# Patient Record
Sex: Female | Born: 1940 | Race: White | Hispanic: No | Smoking: Never smoker
Health system: Southern US, Community
[De-identification: ages and names within clinical notes are randomized; demographics above are authoritative.]

## PROBLEM LIST (undated history)

## (undated) DIAGNOSIS — F0390 Unspecified dementia without behavioral disturbance: Secondary | ICD-10-CM

## (undated) DIAGNOSIS — F419 Anxiety disorder, unspecified: Secondary | ICD-10-CM

## (undated) DIAGNOSIS — I10 Essential (primary) hypertension: Secondary | ICD-10-CM

## (undated) DIAGNOSIS — E119 Type 2 diabetes mellitus without complications: Secondary | ICD-10-CM

## (undated) DIAGNOSIS — I4891 Unspecified atrial fibrillation: Secondary | ICD-10-CM

## (undated) HISTORY — PX: ABDOMINAL HYSTERECTOMY: SHX81

---

## 2016-09-28 DIAGNOSIS — E119 Type 2 diabetes mellitus without complications: Secondary | ICD-10-CM | POA: Diagnosis not present

## 2016-09-28 DIAGNOSIS — E782 Mixed hyperlipidemia: Secondary | ICD-10-CM | POA: Diagnosis not present

## 2016-09-28 DIAGNOSIS — K219 Gastro-esophageal reflux disease without esophagitis: Secondary | ICD-10-CM | POA: Diagnosis not present

## 2016-09-28 DIAGNOSIS — I10 Essential (primary) hypertension: Secondary | ICD-10-CM | POA: Diagnosis not present

## 2016-10-12 DIAGNOSIS — H35373 Puckering of macula, bilateral: Secondary | ICD-10-CM | POA: Diagnosis not present

## 2016-10-12 DIAGNOSIS — H401131 Primary open-angle glaucoma, bilateral, mild stage: Secondary | ICD-10-CM | POA: Diagnosis not present

## 2016-10-12 DIAGNOSIS — E113592 Type 2 diabetes mellitus with proliferative diabetic retinopathy without macular edema, left eye: Secondary | ICD-10-CM | POA: Diagnosis not present

## 2016-10-12 DIAGNOSIS — Z961 Presence of intraocular lens: Secondary | ICD-10-CM | POA: Diagnosis not present

## 2017-01-26 DIAGNOSIS — K219 Gastro-esophageal reflux disease without esophagitis: Secondary | ICD-10-CM | POA: Diagnosis not present

## 2017-01-26 DIAGNOSIS — E782 Mixed hyperlipidemia: Secondary | ICD-10-CM | POA: Diagnosis not present

## 2017-01-26 DIAGNOSIS — I10 Essential (primary) hypertension: Secondary | ICD-10-CM | POA: Diagnosis not present

## 2017-01-26 DIAGNOSIS — E119 Type 2 diabetes mellitus without complications: Secondary | ICD-10-CM | POA: Diagnosis not present

## 2017-02-10 DIAGNOSIS — N390 Urinary tract infection, site not specified: Secondary | ICD-10-CM | POA: Diagnosis not present

## 2017-02-10 DIAGNOSIS — R9431 Abnormal electrocardiogram [ECG] [EKG]: Secondary | ICD-10-CM | POA: Diagnosis not present

## 2017-02-10 DIAGNOSIS — R000 Tachycardia, unspecified: Secondary | ICD-10-CM | POA: Diagnosis not present

## 2017-02-16 DIAGNOSIS — I358 Other nonrheumatic aortic valve disorders: Secondary | ICD-10-CM | POA: Diagnosis not present

## 2017-02-16 DIAGNOSIS — R9431 Abnormal electrocardiogram [ECG] [EKG]: Secondary | ICD-10-CM | POA: Diagnosis not present

## 2017-02-16 DIAGNOSIS — R000 Tachycardia, unspecified: Secondary | ICD-10-CM | POA: Diagnosis not present

## 2017-02-23 DIAGNOSIS — I340 Nonrheumatic mitral (valve) insufficiency: Secondary | ICD-10-CM | POA: Diagnosis not present

## 2017-02-23 DIAGNOSIS — I358 Other nonrheumatic aortic valve disorders: Secondary | ICD-10-CM | POA: Diagnosis not present

## 2017-02-23 DIAGNOSIS — I350 Nonrheumatic aortic (valve) stenosis: Secondary | ICD-10-CM | POA: Diagnosis not present

## 2017-02-23 DIAGNOSIS — I071 Rheumatic tricuspid insufficiency: Secondary | ICD-10-CM | POA: Diagnosis not present

## 2017-03-18 DIAGNOSIS — I10 Essential (primary) hypertension: Secondary | ICD-10-CM | POA: Diagnosis not present

## 2017-03-18 DIAGNOSIS — I471 Supraventricular tachycardia: Secondary | ICD-10-CM | POA: Diagnosis not present

## 2017-03-31 DIAGNOSIS — H35373 Puckering of macula, bilateral: Secondary | ICD-10-CM | POA: Diagnosis not present

## 2017-03-31 DIAGNOSIS — H401131 Primary open-angle glaucoma, bilateral, mild stage: Secondary | ICD-10-CM | POA: Diagnosis not present

## 2017-03-31 DIAGNOSIS — E113592 Type 2 diabetes mellitus with proliferative diabetic retinopathy without macular edema, left eye: Secondary | ICD-10-CM | POA: Diagnosis not present

## 2017-03-31 DIAGNOSIS — Z961 Presence of intraocular lens: Secondary | ICD-10-CM | POA: Diagnosis not present

## 2017-05-28 DIAGNOSIS — K219 Gastro-esophageal reflux disease without esophagitis: Secondary | ICD-10-CM | POA: Diagnosis not present

## 2017-05-28 DIAGNOSIS — R011 Cardiac murmur, unspecified: Secondary | ICD-10-CM | POA: Diagnosis not present

## 2017-05-28 DIAGNOSIS — Z283 Underimmunization status: Secondary | ICD-10-CM | POA: Diagnosis not present

## 2017-05-28 DIAGNOSIS — I10 Essential (primary) hypertension: Secondary | ICD-10-CM | POA: Diagnosis not present

## 2017-05-28 DIAGNOSIS — Z23 Encounter for immunization: Secondary | ICD-10-CM | POA: Diagnosis not present

## 2017-05-28 DIAGNOSIS — E782 Mixed hyperlipidemia: Secondary | ICD-10-CM | POA: Diagnosis not present

## 2017-05-28 DIAGNOSIS — M1990 Unspecified osteoarthritis, unspecified site: Secondary | ICD-10-CM | POA: Diagnosis not present

## 2017-05-28 DIAGNOSIS — Z0000 Encounter for general adult medical examination without abnormal findings: Secondary | ICD-10-CM | POA: Diagnosis not present

## 2017-05-28 DIAGNOSIS — E119 Type 2 diabetes mellitus without complications: Secondary | ICD-10-CM | POA: Diagnosis not present

## 2017-06-22 DIAGNOSIS — R300 Dysuria: Secondary | ICD-10-CM | POA: Diagnosis not present

## 2017-06-22 DIAGNOSIS — E119 Type 2 diabetes mellitus without complications: Secondary | ICD-10-CM | POA: Diagnosis not present

## 2017-06-23 DIAGNOSIS — R300 Dysuria: Secondary | ICD-10-CM | POA: Diagnosis not present

## 2017-07-08 DIAGNOSIS — R300 Dysuria: Secondary | ICD-10-CM | POA: Diagnosis not present

## 2017-07-08 DIAGNOSIS — N390 Urinary tract infection, site not specified: Secondary | ICD-10-CM | POA: Diagnosis not present

## 2017-07-08 DIAGNOSIS — E1165 Type 2 diabetes mellitus with hyperglycemia: Secondary | ICD-10-CM | POA: Diagnosis not present

## 2017-07-22 DIAGNOSIS — N390 Urinary tract infection, site not specified: Secondary | ICD-10-CM | POA: Diagnosis not present

## 2017-07-22 DIAGNOSIS — E1165 Type 2 diabetes mellitus with hyperglycemia: Secondary | ICD-10-CM | POA: Diagnosis not present

## 2017-08-02 DIAGNOSIS — Z961 Presence of intraocular lens: Secondary | ICD-10-CM | POA: Diagnosis not present

## 2017-08-02 DIAGNOSIS — E113592 Type 2 diabetes mellitus with proliferative diabetic retinopathy without macular edema, left eye: Secondary | ICD-10-CM | POA: Diagnosis not present

## 2017-08-02 DIAGNOSIS — H401131 Primary open-angle glaucoma, bilateral, mild stage: Secondary | ICD-10-CM | POA: Diagnosis not present

## 2017-08-02 DIAGNOSIS — H35373 Puckering of macula, bilateral: Secondary | ICD-10-CM | POA: Diagnosis not present

## 2017-09-29 DIAGNOSIS — E119 Type 2 diabetes mellitus without complications: Secondary | ICD-10-CM | POA: Diagnosis not present

## 2017-09-29 DIAGNOSIS — N390 Urinary tract infection, site not specified: Secondary | ICD-10-CM | POA: Diagnosis not present

## 2017-09-29 DIAGNOSIS — E782 Mixed hyperlipidemia: Secondary | ICD-10-CM | POA: Diagnosis not present

## 2017-09-29 DIAGNOSIS — K219 Gastro-esophageal reflux disease without esophagitis: Secondary | ICD-10-CM | POA: Diagnosis not present

## 2017-10-14 DIAGNOSIS — R319 Hematuria, unspecified: Secondary | ICD-10-CM | POA: Diagnosis not present

## 2017-10-14 DIAGNOSIS — I10 Essential (primary) hypertension: Secondary | ICD-10-CM | POA: Diagnosis not present

## 2017-10-14 DIAGNOSIS — I5022 Chronic systolic (congestive) heart failure: Secondary | ICD-10-CM | POA: Diagnosis present

## 2017-10-14 DIAGNOSIS — N12 Tubulo-interstitial nephritis, not specified as acute or chronic: Secondary | ICD-10-CM | POA: Diagnosis not present

## 2017-10-14 DIAGNOSIS — K820 Obstruction of gallbladder: Secondary | ICD-10-CM | POA: Diagnosis not present

## 2017-10-14 DIAGNOSIS — E119 Type 2 diabetes mellitus without complications: Secondary | ICD-10-CM | POA: Diagnosis not present

## 2017-10-14 DIAGNOSIS — N390 Urinary tract infection, site not specified: Secondary | ICD-10-CM | POA: Diagnosis present

## 2017-10-14 DIAGNOSIS — I4891 Unspecified atrial fibrillation: Secondary | ICD-10-CM | POA: Diagnosis not present

## 2017-10-14 DIAGNOSIS — R009 Unspecified abnormalities of heart beat: Secondary | ICD-10-CM | POA: Diagnosis not present

## 2017-10-14 DIAGNOSIS — E871 Hypo-osmolality and hyponatremia: Secondary | ICD-10-CM | POA: Diagnosis not present

## 2017-10-14 DIAGNOSIS — E1165 Type 2 diabetes mellitus with hyperglycemia: Secondary | ICD-10-CM | POA: Diagnosis present

## 2017-10-14 DIAGNOSIS — B9620 Unspecified Escherichia coli [E. coli] as the cause of diseases classified elsewhere: Secondary | ICD-10-CM | POA: Diagnosis present

## 2017-10-14 DIAGNOSIS — R339 Retention of urine, unspecified: Secondary | ICD-10-CM | POA: Diagnosis present

## 2017-10-14 DIAGNOSIS — D649 Anemia, unspecified: Secondary | ICD-10-CM | POA: Diagnosis present

## 2017-10-14 DIAGNOSIS — K5730 Diverticulosis of large intestine without perforation or abscess without bleeding: Secondary | ICD-10-CM | POA: Diagnosis not present

## 2017-10-14 DIAGNOSIS — Z90710 Acquired absence of both cervix and uterus: Secondary | ICD-10-CM | POA: Diagnosis not present

## 2017-10-20 DIAGNOSIS — E119 Type 2 diabetes mellitus without complications: Secondary | ICD-10-CM | POA: Diagnosis not present

## 2017-10-20 DIAGNOSIS — N390 Urinary tract infection, site not specified: Secondary | ICD-10-CM | POA: Diagnosis not present

## 2017-10-20 DIAGNOSIS — M6281 Muscle weakness (generalized): Secondary | ICD-10-CM | POA: Diagnosis not present

## 2017-10-20 DIAGNOSIS — I4891 Unspecified atrial fibrillation: Secondary | ICD-10-CM | POA: Diagnosis not present

## 2017-10-25 DIAGNOSIS — E119 Type 2 diabetes mellitus without complications: Secondary | ICD-10-CM | POA: Diagnosis not present

## 2017-10-25 DIAGNOSIS — I429 Cardiomyopathy, unspecified: Secondary | ICD-10-CM | POA: Diagnosis not present

## 2017-10-25 DIAGNOSIS — I517 Cardiomegaly: Secondary | ICD-10-CM | POA: Diagnosis not present

## 2017-10-25 DIAGNOSIS — I4891 Unspecified atrial fibrillation: Secondary | ICD-10-CM | POA: Diagnosis not present

## 2017-10-25 DIAGNOSIS — I10 Essential (primary) hypertension: Secondary | ICD-10-CM | POA: Diagnosis not present

## 2017-10-25 DIAGNOSIS — Z6824 Body mass index (BMI) 24.0-24.9, adult: Secondary | ICD-10-CM | POA: Diagnosis not present

## 2017-11-09 DIAGNOSIS — E1165 Type 2 diabetes mellitus with hyperglycemia: Secondary | ICD-10-CM | POA: Diagnosis not present

## 2017-11-09 DIAGNOSIS — N390 Urinary tract infection, site not specified: Secondary | ICD-10-CM | POA: Diagnosis not present

## 2017-11-09 DIAGNOSIS — I4891 Unspecified atrial fibrillation: Secondary | ICD-10-CM | POA: Diagnosis not present

## 2017-11-09 DIAGNOSIS — I10 Essential (primary) hypertension: Secondary | ICD-10-CM | POA: Diagnosis not present

## 2017-12-13 DIAGNOSIS — E113592 Type 2 diabetes mellitus with proliferative diabetic retinopathy without macular edema, left eye: Secondary | ICD-10-CM | POA: Diagnosis not present

## 2017-12-13 DIAGNOSIS — H35373 Puckering of macula, bilateral: Secondary | ICD-10-CM | POA: Diagnosis not present

## 2017-12-13 DIAGNOSIS — H401131 Primary open-angle glaucoma, bilateral, mild stage: Secondary | ICD-10-CM | POA: Diagnosis not present

## 2017-12-20 DIAGNOSIS — E1165 Type 2 diabetes mellitus with hyperglycemia: Secondary | ICD-10-CM | POA: Diagnosis not present

## 2017-12-20 DIAGNOSIS — K219 Gastro-esophageal reflux disease without esophagitis: Secondary | ICD-10-CM | POA: Diagnosis not present

## 2017-12-20 DIAGNOSIS — E782 Mixed hyperlipidemia: Secondary | ICD-10-CM | POA: Diagnosis not present

## 2017-12-20 DIAGNOSIS — E119 Type 2 diabetes mellitus without complications: Secondary | ICD-10-CM | POA: Diagnosis not present

## 2017-12-20 DIAGNOSIS — N390 Urinary tract infection, site not specified: Secondary | ICD-10-CM | POA: Diagnosis not present

## 2018-02-28 ENCOUNTER — Ambulatory Visit
Admission: EM | Admit: 2018-02-28 | Discharge: 2018-02-28 | Disposition: A | Discharge status: Home / Self Care | Payer: Medicare Other | Attending: Emergency Medicine | Admitting: Emergency Medicine

## 2018-02-28 ENCOUNTER — Other Ambulatory Visit: Payer: Self-pay

## 2018-02-28 DIAGNOSIS — N3001 Acute cystitis with hematuria: Secondary | ICD-10-CM

## 2018-02-28 DIAGNOSIS — R350 Frequency of micturition: Secondary | ICD-10-CM | POA: Diagnosis not present

## 2018-02-28 LAB — URINALYSIS, COMPLETE (UACMP) WITH MICROSCOPIC
Bilirubin Urine: NEGATIVE
GLUCOSE, UA: NEGATIVE mg/dL
Ketones, ur: NEGATIVE mg/dL
Nitrite: POSITIVE — AB
PH: 6 (ref 5.0–8.0)
Protein, ur: NEGATIVE mg/dL
SPECIFIC GRAVITY, URINE: 1.01 (ref 1.005–1.030)
WBC, UA: >50 WBC/hpf (ref 0–5)

## 2018-02-28 MED ORDER — CEPHALEXIN 500 MG PO CAPS: 500.0000 mg | ORAL_CAPSULE | Freq: Two times a day (BID) | ORAL | 0 refills | Status: AC

## 2018-02-28 NOTE — ED Triage Notes (Signed)
Patient presents to [facility] with daughter. Patient has been noticing urinary frequency and noticed some "memory lapses" that aren't normal but has been seen before with her previous UTIs.

## 2018-02-28 NOTE — ED Provider Notes (Signed)
[facility]    CSN: [id number] Arrival date & time: 02/28/18  [id number]     History   Chief Complaint Chief Complaint  Patient presents with  . Urinary Frequency    HPI Jenna Odom is a 77 y.o. female.   77 year old female brought in by her daughter with concern over urinary frequency and urgency for the past few days. Also has notice some changes in memory and cognition. Denies any fever, dysuria, hematuria, back or abdominal pain or unusual vaginal discharge. Last time she had some cognitive changes, she had a UTI so requests urinalysis. History of 3 UTI's in the past year but no prior UTI before 2018. Was first placed on Cipro and had a reaction. Has taken Macrobid and Keflex with good success. Other chronic health issues include type 2 DM, A fib, HTN and anxiety and currently on Insulin, Metformin, Amaryl, Eliquis and Buspar daily. Only takes Digoxin when heart rate is above 60. Staying with daughter here but patient still lives in [place].   The history is provided by the patient and a relative.    History reviewed. No pertinent past medical history.  There are no active problems to display for this patient.   Past Surgical History:  Procedure Laterality Date  . ABDOMINAL HYSTERECTOMY      OB History   None      Home Medications    Prior to Admission medications   Medication Sig Start Date End Date Taking? Authorizing Provider  apixaban (ELIQUIS) 5 MG TABS tablet Take 5 mg by mouth daily.   Yes [provider]  busPIRone (BUSPAR) 15 MG tablet Take 15 mg by mouth 3 (three) times daily.   Yes [provider]  digoxin (LANOXIN) 0.25 MG tablet Take 0.25 mg by mouth daily.   Yes [provider]  glimepiride (AMARYL) 4 MG tablet Take 4 mg by mouth daily with breakfast.   Yes [provider]  insulin aspart protamine- aspart (NOVOLOG MIX 70/30) (70-30) 100 UNIT/ML injection Inject into the skin.   Yes [provider]  metFORMIN (GLUCOPHAGE) 1000 MG tablet Take 1,000 mg by mouth 2 (two) times daily with a meal.   Yes [provider]  cephALEXin (KEFLEX) 500 MG capsule Take 1 capsule (500 mg total) by mouth 2 (two) times daily for 7 days. 02/28/18 03/07/18  Sudie GrumblingAmyot, Ann Berry, NP    Family History Family History  Problem Relation Age of Onset  . Dementia Mother     Social History Social History   Tobacco Use  . Smoking status: Never Smoker  . Smokeless tobacco: Never Used  Substance Use Topics  . Alcohol use: Never    Frequency: Never  . Drug use: Never     Allergies   Ciprofloxacin   Review of Systems Review of Systems  Constitutional: Negative for activity change, appetite change, chills, fatigue and fever.  Respiratory: Negative for cough, chest tightness, shortness of breath and wheezing.   Cardiovascular: Negative for chest pain and palpitations.  Gastrointestinal: Negative for abdominal pain, diarrhea, nausea and vomiting.  Genitourinary: Positive for decreased urine volume, frequency and urgency. Negative for difficulty urinating, dysuria, flank pain, hematuria, pelvic pain and vaginal discharge.  Musculoskeletal: Positive for arthralgias. Negative for back pain.  Skin: Negative for rash and wound.  Neurological: Negative for dizziness, tremors, seizures, syncope, speech difficulty, weakness, light-headedness and headaches.  Hematological: Negative for adenopathy. Bruises/bleeds easily.  Psychiatric/Behavioral: Positive for confusion.     Physical  Exam Triage Vital Signs ED Triage Vitals  Enc Vitals Group     BP 02/28/18 1029 (!) 155/68     Pulse Rate 02/28/18 1029 (!) 49     Resp 02/28/18 1029 18     Temp 02/28/18 1029 97.8 F (36.6 C)     Temp Source 02/28/18 1029 Oral     SpO2 02/28/18 1029 100 %     Weight 02/28/18 1027 135 lb (61.2 kg)     Height 02/28/18 1027 5' 3.5" (1.613 m)     Head Circumference --      Peak Flow --      Pain Score 02/28/18 1026 0      Pain Loc --      Pain Edu? --      Excl. in GC? --    No data found.  Updated Vital Signs BP (!) 155/68 (BP Location: Left Arm)   Pulse (!) 49   Temp 97.8 F (36.6 C) (Oral)   Resp 18   Ht 5' 3.5" (1.613 m)   Wt 135 lb (61.2 kg)   SpO2 100%   BMI 23.54 kg/m   Visual Acuity Right Eye Distance:   Left Eye Distance:   Bilateral Distance:    Right Eye Near:   Left Eye Near:    Bilateral Near:     Physical Exam  Constitutional: She appears well-developed and well-nourished. She is cooperative. She does not appear ill. No distress.  Patient sitting on exam table in no acute distress.Daughter indicates pulse is usually around 50.     HENT:  Head: Normocephalic and atraumatic.  Eyes: Conjunctivae and EOM are normal.  Neck: Normal range of motion.  Cardiovascular: Regular rhythm. Bradycardia present.  Pulmonary/Chest: Effort normal and breath sounds normal. No respiratory distress. She has no decreased breath sounds. She has no wheezes. She has no rhonchi.  Abdominal: Soft. Normal appearance and bowel sounds are normal. She exhibits no shifting dullness, no distension, no pulsatile liver, no fluid wave, no abdominal bruit, no ascites, no pulsatile midline mass and no mass. There is no tenderness. There is no rigidity, no rebound, no guarding and no CVA tenderness.  Neurological: She is alert. She is disoriented.  Skin: Skin is warm and dry. No rash noted.  Psychiatric: She has a normal mood and affect. Her speech is normal and behavior is normal. Thought content normal.  Vitals reviewed.    UC Treatments / Results  Labs (all labs ordered are listed, but only abnormal results are displayed) Labs Reviewed  URINALYSIS, COMPLETE (UACMP) WITH MICROSCOPIC - Abnormal; Notable for the following components:      Result Value   APPearance CLOUDY (*)    Hgb urine dipstick TRACE (*)    Nitrite POSITIVE (*)    Leukocytes, UA LARGE (*)    Bacteria, UA MANY (*)    All other  components within normal limits  URINE CULTURE    EKG None  Radiology No results found.  Procedures Procedures (including critical care time)  Medications Ordered in UC Medications - No data to display  Initial Impression / Assessment and Plan / UC Course  I have reviewed the triage vital signs and the nursing notes.  Pertinent labs & imaging results that were available during my care of the patient were reviewed by me and considered in my medical decision making (see chart for details).    Reviewed urinalysis results with patient and daughter- probable UTI. Will send urine for culture. Recommend start  Keflex 500mg  twice a day as directed. Increase water intake- avoid soda and caffeine. Continue to monitor sugar/glucose levels. Follow-up pending urine culture results.   Final Clinical Impressions(s) / UC Diagnoses   Final diagnoses:  Urinary frequency  Acute cystitis with hematuria     Discharge Instructions     Recommend start Keflex 500mg  twice a day for 7 days. Increase water intake. Follow-up pending urine culture results.     ED Prescriptions    Medication Sig Dispense Auth. Provider   cephALEXin (KEFLEX) 500 MG capsule Take 1 capsule (500 mg total) by mouth 2 (two) times daily for 7 days. 14 capsule Sudie Grumbling, NP     Controlled Substance Prescriptions [place] Controlled Substance Registry consulted? Not Applicable   Sudie Grumbling, NP 02/28/18 1954

## 2018-02-28 NOTE — Discharge Instructions (Addendum)
Recommend start Keflex 500mg  twice a day for 7 days. Increase water intake. Follow-up pending urine culture results.

## 2018-03-07 DIAGNOSIS — N390 Urinary tract infection, site not specified: Secondary | ICD-10-CM | POA: Diagnosis not present

## 2018-03-07 DIAGNOSIS — I4891 Unspecified atrial fibrillation: Secondary | ICD-10-CM | POA: Diagnosis not present

## 2018-03-07 DIAGNOSIS — E119 Type 2 diabetes mellitus without complications: Secondary | ICD-10-CM | POA: Diagnosis not present

## 2018-03-14 DIAGNOSIS — H35373 Puckering of macula, bilateral: Secondary | ICD-10-CM | POA: Diagnosis not present

## 2018-03-14 DIAGNOSIS — H401131 Primary open-angle glaucoma, bilateral, mild stage: Secondary | ICD-10-CM | POA: Diagnosis not present

## 2018-03-14 DIAGNOSIS — E113592 Type 2 diabetes mellitus with proliferative diabetic retinopathy without macular edema, left eye: Secondary | ICD-10-CM | POA: Diagnosis not present

## 2018-03-14 DIAGNOSIS — Z961 Presence of intraocular lens: Secondary | ICD-10-CM | POA: Diagnosis not present

## 2018-03-25 DIAGNOSIS — N390 Urinary tract infection, site not specified: Secondary | ICD-10-CM | POA: Diagnosis not present

## 2018-03-30 DIAGNOSIS — Z6825 Body mass index (BMI) 25.0-25.9, adult: Secondary | ICD-10-CM | POA: Diagnosis not present

## 2018-03-30 DIAGNOSIS — E119 Type 2 diabetes mellitus without complications: Secondary | ICD-10-CM | POA: Diagnosis not present

## 2018-03-30 DIAGNOSIS — I4891 Unspecified atrial fibrillation: Secondary | ICD-10-CM | POA: Diagnosis not present

## 2018-03-30 DIAGNOSIS — I350 Nonrheumatic aortic (valve) stenosis: Secondary | ICD-10-CM | POA: Diagnosis not present

## 2018-03-30 DIAGNOSIS — I10 Essential (primary) hypertension: Secondary | ICD-10-CM | POA: Diagnosis not present

## 2018-03-30 DIAGNOSIS — R410 Disorientation, unspecified: Secondary | ICD-10-CM | POA: Diagnosis not present

## 2018-04-05 DIAGNOSIS — N390 Urinary tract infection, site not specified: Secondary | ICD-10-CM | POA: Diagnosis not present

## 2018-04-06 DIAGNOSIS — R001 Bradycardia, unspecified: Secondary | ICD-10-CM | POA: Diagnosis not present

## 2018-04-06 DIAGNOSIS — Z87440 Personal history of urinary (tract) infections: Secondary | ICD-10-CM | POA: Diagnosis not present

## 2018-04-06 DIAGNOSIS — Z794 Long term (current) use of insulin: Secondary | ICD-10-CM | POA: Diagnosis not present

## 2018-04-06 DIAGNOSIS — G3184 Mild cognitive impairment, so stated: Secondary | ICD-10-CM | POA: Diagnosis not present

## 2018-04-06 DIAGNOSIS — I4891 Unspecified atrial fibrillation: Secondary | ICD-10-CM | POA: Diagnosis not present

## 2018-04-06 DIAGNOSIS — Z818 Family history of other mental and behavioral disorders: Secondary | ICD-10-CM | POA: Diagnosis not present

## 2018-04-06 DIAGNOSIS — Z820 Family history of epilepsy and other diseases of the nervous system: Secondary | ICD-10-CM | POA: Diagnosis not present

## 2018-04-06 DIAGNOSIS — R030 Elevated blood-pressure reading, without diagnosis of hypertension: Secondary | ICD-10-CM | POA: Diagnosis not present

## 2018-04-06 DIAGNOSIS — Z79899 Other long term (current) drug therapy: Secondary | ICD-10-CM | POA: Diagnosis not present

## 2018-04-06 DIAGNOSIS — I10 Essential (primary) hypertension: Secondary | ICD-10-CM | POA: Diagnosis not present

## 2018-04-06 DIAGNOSIS — F419 Anxiety disorder, unspecified: Secondary | ICD-10-CM | POA: Diagnosis not present

## 2018-04-08 DIAGNOSIS — E119 Type 2 diabetes mellitus without complications: Secondary | ICD-10-CM | POA: Diagnosis not present

## 2018-04-08 DIAGNOSIS — K219 Gastro-esophageal reflux disease without esophagitis: Secondary | ICD-10-CM | POA: Diagnosis not present

## 2018-04-08 DIAGNOSIS — N390 Urinary tract infection, site not specified: Secondary | ICD-10-CM | POA: Diagnosis not present

## 2018-04-08 DIAGNOSIS — I4891 Unspecified atrial fibrillation: Secondary | ICD-10-CM | POA: Diagnosis not present

## 2018-04-11 DIAGNOSIS — N390 Urinary tract infection, site not specified: Secondary | ICD-10-CM | POA: Diagnosis not present

## 2018-04-25 DIAGNOSIS — N184 Chronic kidney disease, stage 4 (severe): Secondary | ICD-10-CM | POA: Diagnosis not present

## 2018-04-25 DIAGNOSIS — N390 Urinary tract infection, site not specified: Secondary | ICD-10-CM | POA: Diagnosis not present

## 2018-04-25 DIAGNOSIS — H6693 Otitis media, unspecified, bilateral: Secondary | ICD-10-CM | POA: Diagnosis not present

## 2018-04-25 DIAGNOSIS — R51 Headache: Secondary | ICD-10-CM | POA: Diagnosis not present

## 2018-04-25 DIAGNOSIS — G4452 New daily persistent headache (NDPH): Secondary | ICD-10-CM | POA: Diagnosis not present

## 2018-05-24 ENCOUNTER — Emergency Department: Payer: Medicare Other

## 2018-05-24 ENCOUNTER — Other Ambulatory Visit: Payer: Self-pay

## 2018-05-24 ENCOUNTER — Emergency Department
Admission: EM | Admit: 2018-05-24 | Discharge: 2018-05-24 | Disposition: A | Discharge status: Home / Self Care | Payer: Medicare Other | Attending: Emergency Medicine | Admitting: Emergency Medicine

## 2018-05-24 ENCOUNTER — Encounter: Payer: Self-pay | Admitting: Emergency Medicine

## 2018-05-24 DIAGNOSIS — S0003XA Contusion of scalp, initial encounter: Secondary | ICD-10-CM | POA: Insufficient documentation

## 2018-05-24 DIAGNOSIS — W108XXA Fall (on) (from) other stairs and steps, initial encounter: Secondary | ICD-10-CM | POA: Insufficient documentation

## 2018-05-24 DIAGNOSIS — S8000XA Contusion of unspecified knee, initial encounter: Secondary | ICD-10-CM

## 2018-05-24 DIAGNOSIS — Y999 Unspecified external cause status: Secondary | ICD-10-CM | POA: Diagnosis not present

## 2018-05-24 DIAGNOSIS — S0990XA Unspecified injury of head, initial encounter: Secondary | ICD-10-CM | POA: Diagnosis not present

## 2018-05-24 DIAGNOSIS — Y9301 Activity, walking, marching and hiking: Secondary | ICD-10-CM | POA: Insufficient documentation

## 2018-05-24 DIAGNOSIS — S022XXA Fracture of nasal bones, initial encounter for closed fracture: Secondary | ICD-10-CM | POA: Insufficient documentation

## 2018-05-24 DIAGNOSIS — S8001XA Contusion of right knee, initial encounter: Secondary | ICD-10-CM | POA: Insufficient documentation

## 2018-05-24 DIAGNOSIS — M7989 Other specified soft tissue disorders: Secondary | ICD-10-CM | POA: Diagnosis not present

## 2018-05-24 DIAGNOSIS — I4891 Unspecified atrial fibrillation: Secondary | ICD-10-CM | POA: Diagnosis not present

## 2018-05-24 DIAGNOSIS — Y929 Unspecified place or not applicable: Secondary | ICD-10-CM | POA: Insufficient documentation

## 2018-05-24 DIAGNOSIS — W19XXXA Unspecified fall, initial encounter: Secondary | ICD-10-CM

## 2018-05-24 DIAGNOSIS — S8991XA Unspecified injury of right lower leg, initial encounter: Secondary | ICD-10-CM | POA: Diagnosis not present

## 2018-05-24 LAB — CBC WITH DIFFERENTIAL/PLATELET
BASOS ABS: 0 10*3/uL (ref 0–0.1)
BASOS PCT: 0 %
EOS PCT: 0 %
Eosinophils Absolute: 0 10*3/uL (ref 0–0.7)
HCT: 36.6 % (ref 35.0–47.0)
Hemoglobin: 12.4 g/dL (ref 12.0–16.0)
Lymphocytes Relative: 13 %
Lymphs Abs: 1.7 10*3/uL (ref 1.0–3.6)
MCH: 29.6 pg (ref 26.0–34.0)
MCHC: 33.9 g/dL (ref 32.0–36.0)
MCV: 87.5 fL (ref 80.0–100.0)
MONO ABS: 0.6 10*3/uL (ref 0.2–0.9)
Monocytes Relative: 4 %
NEUTROS ABS: 10.7 10*3/uL — AB (ref 1.4–6.5)
Neutrophils Relative %: 83 %
Platelets: 212 10*3/uL (ref 150–440)
RBC: 4.18 MIL/uL (ref 3.80–5.20)
RDW: 15.4 % — AB (ref 11.5–14.5)
WBC: 13 10*3/uL — AB (ref 3.6–11.0)

## 2018-05-24 LAB — COMPREHENSIVE METABOLIC PANEL
ALBUMIN: 4.1 g/dL (ref 3.5–5.0)
ALT: 11 U/L (ref 0–44)
AST: 22 U/L (ref 15–41)
Alkaline Phosphatase: 55 U/L (ref 38–126)
Anion gap: 13 (ref 5–15)
BUN: 19 mg/dL (ref 8–23)
CO2: 24 mmol/L (ref 22–32)
Calcium: 9 mg/dL (ref 8.9–10.3)
Chloride: 98 mmol/L (ref 98–111)
Creatinine, Ser: 1.04 mg/dL — ABNORMAL HIGH (ref 0.44–1.00)
GFR calc non Af Amer: 50 mL/min — ABNORMAL LOW (ref >60)
GFR, EST AFRICAN AMERICAN: 59 mL/min — AB (ref >60)
GLUCOSE: 205 mg/dL — AB (ref 70–99)
POTASSIUM: 4.8 mmol/L (ref 3.5–5.1)
SODIUM: 135 mmol/L (ref 135–145)
TOTAL PROTEIN: 7 g/dL (ref 6.5–8.1)
Total Bilirubin: 1.2 mg/dL (ref 0.3–1.2)

## 2018-05-24 LAB — DIGOXIN LEVEL

## 2018-05-24 LAB — TROPONIN I: Troponin I: <0.03 ng/mL (ref <0.03)

## 2018-05-24 MED ORDER — OXYMETAZOLINE HCL 0.05 % NA SOLN
1.0000 | Freq: Once | NASAL | Status: AC
  Administered 2018-05-24: 1 via NASAL
  Filled 2018-05-24: qty 15

## 2018-05-24 MED ORDER — METOPROLOL TARTRATE 25 MG PO TABS
25.0000 mg | ORAL_TABLET | Freq: Once | ORAL | Status: AC
  Administered 2018-05-24: 25 mg via ORAL
  Filled 2018-05-24: qty 1

## 2018-05-24 MED ORDER — TRAMADOL HCL 50 MG PO TABS: 50.0000 mg | ORAL_TABLET | Freq: Four times a day (QID) | ORAL | 0 refills | Status: DC | PRN

## 2018-05-24 NOTE — ED Triage Notes (Signed)
Pt here with c/o right knee pain and face pain from fall this am around 0230.  Daughter states her mom got turned around in the dark this am and missed the last few steps, nose bled after fall, pt is on blood thinner. Large area of bruising and swelling noted to face, right knee swollen, denies LOC.

## 2018-05-24 NOTE — ED Provider Notes (Signed)
[facility] Emergency Department Provider Note       Time seen: ----------------------------------------- 9:19 AM on 05/24/2018 -----------------------------------------   I have reviewed the triage vital signs and the nursing notes.  HISTORY   Chief Complaint Fall    HPI Jenna Odom is a 77 y.o. female with a history of atrial fibrillation who presents to the ED for a fall.  Patient states she was walking downstairs and thinks she misstepped.  She has not been weak or dizzy, has not felt ill.  She hit her face and right knee.  She is not complaining of any pain but had obvious swelling to her face and knee.  She did have a nosebleed initially but this has resolved.  Currently she is taking Eliquis for anticoagulation.  History reviewed. No pertinent past medical history.  There are no active problems to display for this patient.   Past Surgical History:  Procedure Laterality Date  . ABDOMINAL HYSTERECTOMY      Allergies Ciprofloxacin  Social History Social History   Tobacco Use  . Smoking status: Never Smoker  . Smokeless tobacco: Never Used  Substance Use Topics  . Alcohol use: Never    Frequency: Never  . Drug use: Never   Review of Systems Constitutional: Negative for fever. Eyes: Negative for vision changes ENT: Positive for nosebleed, nasal swelling, facial swelling Cardiovascular: Negative for chest pain. Respiratory: Negative for shortness of breath. Gastrointestinal: Negative for abdominal pain, vomiting and diarrhea. Genitourinary: Negative for dysuria. Musculoskeletal: Positive for right knee pain Skin: Negative for rash. Neurological: Negative for headaches, focal weakness or numbness.  All systems negative/normal/unremarkable except as stated in the HPI  ____________________________________________   PHYSICAL EXAM:  VITAL SIGNS: ED Triage Vitals  Enc Vitals Group     BP 05/24/18 0914 93/71     Pulse Rate  05/24/18 0914 (!) 136     Resp 05/24/18 0914 16     Temp 05/24/18 0914 97.6 F (36.4 C)     Temp src --      SpO2 05/24/18 0914 98 %     Weight 05/24/18 0915 135 lb (61.2 kg)     Height 05/24/18 0915 5\' 2"  (1.575 m)     Head Circumference --      Peak Flow --      Pain Score 05/24/18 0915 10     Pain Loc --      Pain Edu? --      Excl. in GC? --    Constitutional: Alert and oriented. Well appearing and in no distress. Eyes: Conjunctivae are normal. Normal extraocular movements. ENT   Head: Normocephalic, extensive facial ecchymosis is noted, there is widening of the nasal bridge with surrounding ecchymosis consistent with nasal fracture.  There is bilateral infraorbital ecchymosis as well   Nose: No congestion/rhinnorhea.   Mouth/Throat: Mucous membranes are moist.  Swelling of the upper lip with ecchymosis is noted   Neck: No stridor. Cardiovascular: Normal rate, regular rhythm. No murmurs, rubs, or gallops. Respiratory: Normal respiratory effort without tachypnea nor retractions. Breath sounds are clear and equal bilaterally. No wheezes/rales/rhonchi. Gastrointestinal: Soft and nontender. Normal bowel sounds Musculoskeletal: Mild pain with range of motion of the right knee.  Right knee swelling is noted medially with ecchymosis Neurologic:  Normal speech and language. No gross focal neurologic deficits are appreciated.  Skin:  Skin is warm, dry and intact. No rash noted. Psychiatric: Mood and affect are normal. Speech and behavior are normal.  ____________________________________________  EKG: Interpreted by me.  Atrial fibrillation with a rapid ventricular response, rate is 137 bpm, normal QRS, normal QT.  ____________________________________________  ED COURSE:  As part of my medical decision making, I reviewed the following data within the electronic MEDICAL RECORD NUMBER History obtained from family if available, nursing notes, old chart and ekg, as well as notes  from prior ED visits. Patient presented for a fall, we will assess with labs and imaging as indicated at this time.   Procedures ____________________________________________   LABS (pertinent positives/negatives)  Labs Reviewed  CBC WITH DIFFERENTIAL/PLATELET - Abnormal; Notable for the following components:      Result Value   WBC 13.0 (*)    RDW 15.4 (*)    Neutro Abs 10.7 (*)    All other components within normal limits  COMPREHENSIVE METABOLIC PANEL - Abnormal; Notable for the following components:   Glucose, Bld 205 (*)    Creatinine, Ser 1.04 (*)    GFR calc non Af Amer 50 (*)    GFR calc Af Amer 59 (*)    All other components within normal limits  DIGOXIN LEVEL - Abnormal; Notable for the following components:   Digoxin Level <0.2 (*)    All other components within normal limits  TROPONIN I    RADIOLOGY Images were viewed by me  CT head, maxilla facial, right knee x-rays IMPRESSION: Head CT: Atrophy and mild chronic small-vessel ischemic change. No traumatic intracranial finding. Right parietal scalp hematoma.  Maxillofacial CT: Mildly depressed nasal fractures. Fracture of the nasal septum. No other facial fracture. Swelling of the nose and right cheek.  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 IMPRESSION: Moderate degenerative changes in the medial patellar femoral compartments.  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 ____________________________________________  DIFFERENTIAL DIAGNOSIS   Fall, contusion, nasal fracture, subarachnoid hemorrhage, subdural hematoma, arrhythmia, occult infection  FINAL ASSESSMENT AND PLAN  Fall, nasal fractures, knee contusion   Plan: The patient had presented for a fall. Patient's labs did not reveal any acute abnormality. Patient's imaging did reveal nasal fractures as expected.  X-rays of the knee were normal, no intracranial injury was  noted.  She will be discharged with pain medicine and will be referred to ENT for close outpatient follow-up.   Ulice DashJohnathan E Williams, MD   Note: This note was generated in part or whole with voice recognition software. Voice recognition is usually quite accurate but there are transcription errors that can and very often do occur. I apologize for any typographical errors that were not detected and corrected.     Emily FilbertWilliams, Jonathan E, MD 05/24/18 1224

## 2018-05-24 NOTE — ED Notes (Signed)
Pt placed on bedpan and was not able to urinate.

## 2018-05-24 NOTE — ED Notes (Addendum)
Pt fell this morning on the floor while walking in the dark. Pt on blood thinner. Right knee and nose swollen with bruising present around knee and under eyes. A/Ox4. NAD. Can transfer self from wheelchair to bed.

## 2018-05-24 NOTE — ED Notes (Signed)
Pt returned from scans.  

## 2018-05-24 NOTE — ED Notes (Signed)
Pt taken to scans via stretcher.  

## 2018-05-28 ENCOUNTER — Ambulatory Visit
Admission: EM | Admit: 2018-05-28 | Discharge: 2018-05-28 | Disposition: A | Discharge status: Home / Self Care | Payer: Medicare Other | Attending: Family Medicine | Admitting: Family Medicine

## 2018-05-28 ENCOUNTER — Encounter: Payer: Self-pay | Admitting: Gynecology

## 2018-05-28 ENCOUNTER — Ambulatory Visit: Payer: Medicare Other

## 2018-05-28 DIAGNOSIS — I4891 Unspecified atrial fibrillation: Secondary | ICD-10-CM | POA: Diagnosis not present

## 2018-05-28 DIAGNOSIS — Z794 Long term (current) use of insulin: Secondary | ICD-10-CM | POA: Insufficient documentation

## 2018-05-28 DIAGNOSIS — E119 Type 2 diabetes mellitus without complications: Secondary | ICD-10-CM | POA: Diagnosis not present

## 2018-05-28 DIAGNOSIS — Z881 Allergy status to other antibiotic agents status: Secondary | ICD-10-CM | POA: Diagnosis not present

## 2018-05-28 DIAGNOSIS — F419 Anxiety disorder, unspecified: Secondary | ICD-10-CM | POA: Insufficient documentation

## 2018-05-28 DIAGNOSIS — F05 Delirium due to known physiological condition: Secondary | ICD-10-CM

## 2018-05-28 DIAGNOSIS — Z7901 Long term (current) use of anticoagulants: Secondary | ICD-10-CM | POA: Insufficient documentation

## 2018-05-28 DIAGNOSIS — N390 Urinary tract infection, site not specified: Secondary | ICD-10-CM | POA: Diagnosis not present

## 2018-05-28 DIAGNOSIS — S299XXA Unspecified injury of thorax, initial encounter: Secondary | ICD-10-CM | POA: Diagnosis not present

## 2018-05-28 DIAGNOSIS — I700 Atherosclerosis of aorta: Secondary | ICD-10-CM | POA: Diagnosis not present

## 2018-05-28 DIAGNOSIS — Z79899 Other long term (current) drug therapy: Secondary | ICD-10-CM | POA: Diagnosis not present

## 2018-05-28 DIAGNOSIS — R410 Disorientation, unspecified: Secondary | ICD-10-CM

## 2018-05-28 HISTORY — DX: Type 2 diabetes mellitus without complications: E11.9

## 2018-05-28 HISTORY — DX: Unspecified atrial fibrillation: I48.91

## 2018-05-28 HISTORY — DX: Anxiety disorder, unspecified: F41.9

## 2018-05-28 LAB — COMPREHENSIVE METABOLIC PANEL
ALT: 15 U/L (ref 0–44)
AST: 22 U/L (ref 15–41)
Albumin: 4 g/dL (ref 3.5–5.0)
Alkaline Phosphatase: 58 U/L (ref 38–126)
Anion gap: 12 (ref 5–15)
BILIRUBIN TOTAL: 1.2 mg/dL (ref 0.3–1.2)
BUN: 22 mg/dL (ref 8–23)
CHLORIDE: 97 mmol/L — AB (ref 98–111)
CO2: 26 mmol/L (ref 22–32)
CREATININE: 1.19 mg/dL — AB (ref 0.44–1.00)
Calcium: 9 mg/dL (ref 8.9–10.3)
GFR, EST AFRICAN AMERICAN: 50 mL/min — AB (ref >60)
GFR, EST NON AFRICAN AMERICAN: 43 mL/min — AB (ref >60)
Glucose, Bld: 210 mg/dL — ABNORMAL HIGH (ref 70–99)
POTASSIUM: 4.6 mmol/L (ref 3.5–5.1)
Sodium: 135 mmol/L (ref 135–145)
Total Protein: 7.2 g/dL (ref 6.5–8.1)

## 2018-05-28 LAB — CBC WITH DIFFERENTIAL/PLATELET
Basophils Absolute: 0 10*3/uL (ref 0–0.1)
Basophils Relative: 1 %
EOS ABS: 0.1 10*3/uL (ref 0–0.7)
EOS PCT: 1 %
HCT: 35.1 % (ref 35.0–47.0)
Hemoglobin: 11.8 g/dL — ABNORMAL LOW (ref 12.0–16.0)
LYMPHS PCT: 25 %
Lymphs Abs: 1.7 10*3/uL (ref 1.0–3.6)
MCH: 29.6 pg (ref 26.0–34.0)
MCHC: 33.6 g/dL (ref 32.0–36.0)
MCV: 88 fL (ref 80.0–100.0)
Monocytes Absolute: 0.4 10*3/uL (ref 0.2–0.9)
Monocytes Relative: 6 %
NEUTROS PCT: 67 %
Neutro Abs: 4.7 10*3/uL (ref 1.4–6.5)
Platelets: 251 10*3/uL (ref 150–440)
RBC: 3.98 MIL/uL (ref 3.80–5.20)
RDW: 15.1 % — ABNORMAL HIGH (ref 11.5–14.5)
WBC: 6.9 10*3/uL (ref 3.6–11.0)

## 2018-05-28 LAB — URINALYSIS, COMPLETE (UACMP) WITH MICROSCOPIC
Bilirubin Urine: NEGATIVE
GLUCOSE, UA: NEGATIVE mg/dL
HGB URINE DIPSTICK: NEGATIVE
KETONES UR: NEGATIVE mg/dL
NITRITE: NEGATIVE
PH: 5.5 (ref 5.0–8.0)
Specific Gravity, Urine: 1.01 (ref 1.005–1.030)

## 2018-05-28 MED ORDER — CEPHALEXIN 500 MG PO CAPS: 500.0000 mg | ORAL_CAPSULE | Freq: Two times a day (BID) | ORAL | 0 refills | Status: DC

## 2018-05-28 NOTE — ED Triage Notes (Signed)
Per daughter wanted her mom to be check for urinary tract infection.

## 2018-05-28 NOTE — ED Provider Notes (Signed)
[facility]    CSN: [id number] Arrival date & time: 05/28/18  0807     History   Chief Complaint Chief Complaint  Patient presents with  . Urinary Tract Infection    HPI Jenna Odom is a 77 y.o. female.   77 yo female presents with a daughter with a c/o intermittent confusion per daughter. Patient denies any symptoms. Daughter states that patient had a recent fall hitting her face a few days ago and was evaluated in the ED. Daughter also states that in the past patient has had UTIs when similar symptoms have developed. No fevers, vomiting or abdominal pain.   The history is provided by the patient.  Urinary Tract Infection    Past Medical History:  Diagnosis Date  . A-fib (HCC)   . Anxiety   . Diabetes mellitus without complication (HCC)     There are no active problems to display for this patient.   Past Surgical History:  Procedure Laterality Date  . ABDOMINAL HYSTERECTOMY      OB History   None      Home Medications    Prior to Admission medications   Medication Sig Start Date End Date Taking? Authorizing Provider  apixaban (ELIQUIS) 5 MG TABS tablet Take 5 mg by mouth daily.   Yes [provider]  busPIRone (BUSPAR) 15 MG tablet Take 15 mg by mouth 3 (three) times daily.   Yes [provider]  digoxin (LANOXIN) 0.25 MG tablet Take 0.25 mg by mouth daily.   Yes [provider]  glimepiride (AMARYL) 4 MG tablet Take 4 mg by mouth daily with breakfast.   Yes [provider]  insulin aspart protamine- aspart (NOVOLOG MIX 70/30) (70-30) 100 UNIT/ML injection Inject into the skin.   Yes [provider]  metFORMIN (GLUCOPHAGE) 1000 MG tablet Take 1,000 mg by mouth 2 (two) times daily with a meal.   Yes [provider]  traMADol (ULTRAM) 50 MG tablet Take 1 tablet (50 mg total) by mouth every 6 (six) hours as needed. 05/24/18 05/24/19 Yes Emily Filbert, MD  cephALEXin (KEFLEX) 500 MG  capsule Take 1 capsule (500 mg total) by mouth 2 (two) times daily. 05/28/18   Payton Mccallum, MD    Family History Family History  Problem Relation Age of Onset  . Dementia Mother     Social History Social History   Tobacco Use  . Smoking status: Never Smoker  . Smokeless tobacco: Never Used  Substance Use Topics  . Alcohol use: Never    Frequency: Never  . Drug use: Never     Allergies   Ciprofloxacin   Review of Systems Review of Systems   Physical Exam Triage Vital Signs ED Triage Vitals  Enc Vitals Group     BP 05/28/18 0819 102/62     Pulse Rate 05/28/18 0819 (!) 53     Resp 05/28/18 0819 12     Temp 05/28/18 0819 97.7 F (36.5 C)     Temp Source 05/28/18 0819 Axillary     SpO2 05/28/18 0819 98 %     Weight 05/28/18 0815 135 lb (61.2 kg)     Height --      Head Circumference --      Peak Flow --      Pain Score 05/28/18 0815 0     Pain Loc --      Pain Edu? --      Excl. in GC? --  No data found.  Updated Vital Signs BP 102/62 (BP Location: Left Arm)   Pulse (!) 53   Temp 97.7 F (36.5 C) (Axillary)   Resp 12   Wt 61.2 kg   SpO2 98%   BMI 24.69 kg/m   Visual Acuity Right Eye Distance:   Left Eye Distance:   Bilateral Distance:    Right Eye Near:   Left Eye Near:    Bilateral Near:     Physical Exam  Constitutional: She appears well-developed and well-nourished. No distress.  HENT:  Multiple ecchymosis on face  Eyes: Pupils are equal, round, and reactive to light. EOM are normal.  Cardiovascular: Normal rate, regular rhythm and normal heart sounds.  Pulmonary/Chest: Effort normal. No stridor. No respiratory distress. She has no wheezes. She has no rales.  Few rhonchi  Abdominal: Soft. Bowel sounds are normal. She exhibits no distension and no mass. There is tenderness. There is no rebound and no guarding.  Neurological: She is alert. She displays normal reflexes. No cranial nerve deficit or sensory deficit. She exhibits normal  muscle tone. Coordination normal.  Oriented x 2 (baseline)  Skin: She is not diaphoretic.  Nursing note and vitals reviewed.    UC Treatments / Results  Labs (all labs ordered are listed, but only abnormal results are displayed) Labs Reviewed  URINALYSIS, COMPLETE (UACMP) WITH MICROSCOPIC - Abnormal; Notable for the following components:      Result Value   APPearance HAZY (*)    Protein, ur TRACE (*)    Leukocytes, UA LARGE (*)    Non Squamous Epithelial PRESENT (*)    Bacteria, UA FEW (*)    All other components within normal limits  COMPREHENSIVE METABOLIC PANEL - Abnormal; Notable for the following components:   Chloride 97 (*)    Glucose, Bld 210 (*)    Creatinine, Ser 1.19 (*)    GFR calc non Af Amer 43 (*)    GFR calc Af Amer 50 (*)    All other components within normal limits  CBC WITH DIFFERENTIAL/PLATELET - Abnormal; Notable for the following components:   Hemoglobin 11.8 (*)    RDW 15.1 (*)    All other components within normal limits  URINE CULTURE    EKG None  Radiology Dg Chest 2 View  Result Date: 05/28/2018 CLINICAL DATA:  Confusion.  Recent fall. EXAM: CHEST - 2 VIEW COMPARISON:  None. FINDINGS: Heart size and pulmonary vascularity are normal. Aortic atherosclerosis. Lungs are clear. No bone abnormality. IMPRESSION: No acute abnormality. Aortic Atherosclerosis (ICD10-I70.0). Electronically Signed   By: Francene BoyersJames  Maxwell M.D.   On: 05/28/2018 09:42    Procedures Procedures (including critical care time)  Medications Ordered in UC Medications - No data to display  Initial Impression / Assessment and Plan / UC Course  I have reviewed the triage vital signs and the nursing notes.  Pertinent labs & imaging results that were available during my care of the patient were reviewed by me and considered in my medical decision making (see chart for details).      Final Clinical Impressions(s) / UC Diagnoses   Final diagnoses:  Urinary tract infection  without hematuria, site unspecified  Subacute confusional state     Discharge Instructions     Drink more water    ED Prescriptions    Medication Sig Dispense Auth. Provider   cephALEXin (KEFLEX) 500 MG capsule Take 1 capsule (500 mg total) by mouth 2 (two) times daily. 14 capsule Payton Mccallumonty, Kaliya Shreiner, MD  1. Labs/x-ray results and diagnosis reviewed with patient and daughter 2. rx as per orders above; reviewed possible side effects, interactions, risks and benefits  3. Recommend supportive treatment as above  4. Follow-up prn if symptoms worsen or don't improve   Controlled Substance Prescriptions [place] Controlled Substance Registry consulted? Not Applicable   Payton Mccallum, MD 05/28/18 [id number]

## 2018-05-28 NOTE — Discharge Instructions (Signed)
Drink more water 

## 2018-05-30 LAB — URINE CULTURE
CULTURE: NO GROWTH
SPECIAL REQUESTS: NORMAL

## 2018-06-03 ENCOUNTER — Encounter: Payer: Self-pay | Admitting: Emergency Medicine

## 2018-06-03 ENCOUNTER — Ambulatory Visit
Admission: EM | Admit: 2018-06-03 | Discharge: 2018-06-03 | Disposition: A | Discharge status: Home / Self Care | Payer: Medicare Other | Attending: Emergency Medicine | Admitting: Emergency Medicine

## 2018-06-03 ENCOUNTER — Other Ambulatory Visit: Payer: Self-pay

## 2018-06-03 DIAGNOSIS — R7989 Other specified abnormal findings of blood chemistry: Secondary | ICD-10-CM

## 2018-06-03 DIAGNOSIS — I4891 Unspecified atrial fibrillation: Secondary | ICD-10-CM | POA: Diagnosis not present

## 2018-06-03 DIAGNOSIS — E1165 Type 2 diabetes mellitus with hyperglycemia: Secondary | ICD-10-CM | POA: Diagnosis not present

## 2018-06-03 DIAGNOSIS — Z7901 Long term (current) use of anticoagulants: Secondary | ICD-10-CM | POA: Insufficient documentation

## 2018-06-03 DIAGNOSIS — Z7984 Long term (current) use of oral hypoglycemic drugs: Secondary | ICD-10-CM | POA: Insufficient documentation

## 2018-06-03 DIAGNOSIS — I4892 Unspecified atrial flutter: Secondary | ICD-10-CM | POA: Insufficient documentation

## 2018-06-03 DIAGNOSIS — R410 Disorientation, unspecified: Secondary | ICD-10-CM

## 2018-06-03 DIAGNOSIS — F05 Delirium due to known physiological condition: Secondary | ICD-10-CM

## 2018-06-03 DIAGNOSIS — E119 Type 2 diabetes mellitus without complications: Secondary | ICD-10-CM | POA: Diagnosis not present

## 2018-06-03 DIAGNOSIS — F419 Anxiety disorder, unspecified: Secondary | ICD-10-CM | POA: Insufficient documentation

## 2018-06-03 DIAGNOSIS — Z79899 Other long term (current) drug therapy: Secondary | ICD-10-CM | POA: Insufficient documentation

## 2018-06-03 DIAGNOSIS — R443 Hallucinations, unspecified: Secondary | ICD-10-CM | POA: Diagnosis not present

## 2018-06-03 LAB — CBC WITH DIFFERENTIAL/PLATELET
BASOS PCT: 1 %
Basophils Absolute: 0 10*3/uL (ref 0–0.1)
EOS ABS: 0.2 10*3/uL (ref 0–0.7)
EOS PCT: 2 %
HCT: 34.8 % — ABNORMAL LOW (ref 35.0–47.0)
Hemoglobin: 11.7 g/dL — ABNORMAL LOW (ref 12.0–16.0)
LYMPHS ABS: 2.1 10*3/uL (ref 1.0–3.6)
Lymphocytes Relative: 28 %
MCH: 29.7 pg (ref 26.0–34.0)
MCHC: 33.6 g/dL (ref 32.0–36.0)
MCV: 88.4 fL (ref 80.0–100.0)
MONOS PCT: 5 %
Monocytes Absolute: 0.4 10*3/uL (ref 0.2–0.9)
Neutro Abs: 4.9 10*3/uL (ref 1.4–6.5)
Neutrophils Relative %: 64 %
PLATELETS: 375 10*3/uL (ref 150–440)
RBC: 3.93 MIL/uL (ref 3.80–5.20)
RDW: 15.4 % — ABNORMAL HIGH (ref 11.5–14.5)
WBC: 7.6 10*3/uL (ref 3.6–11.0)

## 2018-06-03 LAB — BASIC METABOLIC PANEL
Anion gap: 10 (ref 5–15)
BUN: 20 mg/dL (ref 8–23)
CHLORIDE: 95 mmol/L — AB (ref 98–111)
CO2: 27 mmol/L (ref 22–32)
CREATININE: 1.53 mg/dL — AB (ref 0.44–1.00)
Calcium: 9.4 mg/dL (ref 8.9–10.3)
GFR calc Af Amer: 37 mL/min — ABNORMAL LOW (ref >60)
GFR calc non Af Amer: 32 mL/min — ABNORMAL LOW (ref >60)
GLUCOSE: 285 mg/dL — AB (ref 70–99)
Potassium: 4.8 mmol/L (ref 3.5–5.1)
Sodium: 132 mmol/L — ABNORMAL LOW (ref 135–145)

## 2018-06-03 LAB — URINALYSIS, COMPLETE (UACMP) WITH MICROSCOPIC
Bilirubin Urine: NEGATIVE
Glucose, UA: NEGATIVE mg/dL
Hgb urine dipstick: NEGATIVE
Ketones, ur: NEGATIVE mg/dL
Nitrite: NEGATIVE
Protein, ur: NEGATIVE mg/dL
RBC / HPF: NONE SEEN RBC/hpf (ref 0–5)
Specific Gravity, Urine: 1.01 (ref 1.005–1.030)
pH: 5.5 (ref 5.0–8.0)

## 2018-06-03 LAB — DIGOXIN LEVEL: Digoxin Level: <0.2 ng/mL — ABNORMAL LOW (ref 0.8–2.0)

## 2018-06-03 NOTE — Discharge Instructions (Addendum)
Her creatinine is a little elevated compared to last time, but I suspect it is because she is not drinking as much fluids.  I would push fluids.  The tramadol may be contributing to the hallucinations but since she was having them prior to starting it, I do not think that this is the root cause.  We will call you if her digoxin level is elevated above normal.  If you do not hear from [place], that her digoxin level is normal.  You will need to go to the ER in that case.  Otherwise her labs are unremarkable.  You need to follow-up with the primary care physician of your choice as soon as you possibly can.  This could be early dementia.  See list below. Go immediately to the ER for any change in mental status, fevers above 100.4, or other concerns.    Here is a list of primary care providers who are taking new patients:  Dr. Elizabeth Sauer, Dr. Schuyler Amor [address] [place] [address] [phone]  [facility] [address] [place] [address]  [phone]  [facility] [address] [place], [place] [address] [phone]  [facility] [address] [place]  [phone] [place], [place] [address]  Here are clinics/ other resources who will see you if you do not have insurance. Some have certain criteria that you must meet. Call them and find out what they are:  [facility]: [address], [place], [place] [address] Phone: [phone] Hours: First and Third Saturdays of each Month, 9 a.m. - 1 p.m.  [facility]: [address], [address] [place], [place] [address] Phone: [phone] Hours: Tuesday, 4 p.m. - 8 p.m. Thursday, 1 p.m. - 8 p.m. Wednesday, 9 a.m. - [facility] [address] [place], [place] [address] Phone: [phone] Pharmacy Phone Number: [phone] Dental Phone Number: [phone] [facility] Insurance Help: [phone]  Dental Hours: Monday - Thursday, 8 a.m. - 6 p.m.  [facility] [address], [place], [place] [address] Phone: [phone] Pharmacy Phone Number: [phone] [facility] Insurance Help: [phone]  [facility] [address] [place] [place], [place] [address] Phone: [phone] Pharmacy Phone Number: [phone] [facility] Insurance Help: [phone]  [facility] [address] [place], [place] [address] Phone: [phone] [facility] Insurance Help: [phone]   [facility] [address], [place], [place] [address] Phone: [phone]  Go to www.goodrx.com to look up your medications. This will give you a list of where you can find your prescriptions at the most affordable prices. Or ask the pharmacist what the cash price is, or if they have any other discount programs available to help make your medication more affordable. This can be less expensive than what you would pay with insurance.

## 2018-06-03 NOTE — ED Triage Notes (Signed)
Patient c/o urinary retension for the past couple of day.  Patient states that her last doses of antibiotic was today for a UTI.  Patient's daughter states that she still seems disorientated.

## 2018-06-03 NOTE — ED Provider Notes (Signed)
HPI  SUBJECTIVE:  Jenna Odom is a 77 y.o. female who presents with 2 weeks of intermittent visual hallucinations.  Denies auditory hallucinations.  Daughter states that the patient is acting normally other than the hallucinations.  States that she gets hallucinations every time patient gets a UTI for the past 2 or 3 years.  No change in medications other than starting tramadol nightly 2 weeks ago status post fall.  She had a mechanical fall 2 weeks ago, was seen in the ED, found to have negative head CT.  Daughter states the patient was having hallucinations prior to the fall.  No change in her digoxin dosing.  No nausea, vomiting, no headache.  No arm or leg weakness, slurred speech, facial droop.  No coughing, wheezing, chest pain, shortness of breath.  Patient states that she feels like she can completely empty her bladder.  No pelvic, abdominal pain.  No urinary incontinence, abdominal swelling, new back pain.  Daughter reports decreased urine output, but states that the patient is not drinking as much water.  There are no aggravating or alleviating factors.  Daughter has not tried anything for this.  Daughter does not bring her in for urinary retention, rather for the hallucinations.  Past medical history of atrial fibrillation which she takes digoxin and Eliquis, diabetes, current UTI.  She is visiting here from [place], staying with daughter. Moved here 6 weeks ago as patient was having difficulty taking care of herself.  Seen here 6 days ago with intermittent confusion, thought to have a UTI with a large leukocytes, few bacteria.  Sent home with Keflex for a week.  Her urine culture was negative for UTI.   Past Medical History:  Diagnosis Date  . A-fib (HCC)   . Anxiety   . Diabetes mellitus without complication [facility])     Past Surgical History:  Procedure Laterality Date  . ABDOMINAL HYSTERECTOMY      Family History  Problem Relation Age of Onset  . Dementia Mother      Social History   Tobacco Use  . Smoking status: Never Smoker  . Smokeless tobacco: Never Used  Substance Use Topics  . Alcohol use: Never    Frequency: Never  . Drug use: Never    No current facility-administered medications for this encounter.   Current Outpatient Medications:  .  apixaban (ELIQUIS) 5 MG TABS tablet, Take 5 mg by mouth daily., Disp: , Rfl:  .  busPIRone (BUSPAR) 15 MG tablet, Take 15 mg by mouth 3 (three) times daily., Disp: , Rfl:  .  cephALEXin (KEFLEX) 500 MG capsule, Take 1 capsule (500 mg total) by mouth 2 (two) times daily., Disp: 14 capsule, Rfl: 0 .  digoxin (LANOXIN) 0.25 MG tablet, Take 0.25 mg by mouth daily., Disp: , Rfl:  .  glimepiride (AMARYL) 4 MG tablet, Take 4 mg by mouth daily with breakfast., Disp: , Rfl:  .  insulin aspart protamine- aspart (NOVOLOG MIX 70/30) (70-30) 100 UNIT/ML injection, Inject into the skin., Disp: , Rfl:  .  metFORMIN (GLUCOPHAGE) 1000 MG tablet, Take 1,000 mg by mouth 2 (two) times daily with a meal., Disp: , Rfl:  .  traMADol (ULTRAM) 50 MG tablet, Take 1 tablet (50 mg total) by mouth every 6 (six) hours as needed., Disp: 20 tablet, Rfl: 0  Allergies  Allergen Reactions  . Ciprofloxacin Nausea And Vomiting     ROS  As noted in HPI.   Physical Exam  BP 122/79 (BP Location: Left Arm)  Pulse 75   Temp 97.6 F (36.4 C) (Oral)   Resp 16   Ht 5\' 2"  (1.575 m)   Wt 61.2 kg   SpO2 100%   BMI 24.68 kg/m   Constitutional: Well developed, well nourished, no acute distress Eyes:  EOMI, conjunctiva normal bilaterally HENT: Normocephalic, bruising over the face, mucus membranes moist Respiratory: Normal inspiratory effort, lungs clear bilaterally Cardiovascular: Irregularly irregular.  No murmurs, rubs, gallops GI: nondistended soft, nontender.  No rebound, guarding.  Active bowel sounds.   Back: No CVAT  skin: No rash, skin intact Musculoskeletal: no deformities Neurologic: Alert & oriented x 2, no focal  neuro deficits.  Speech clear, goal oriented thoughts.  Daughter states the patient is at her baseline. Psychiatric: Speech and behavior appropriate   ED Course   Medications - No data to display  Orders Placed This Encounter  Procedures  . Urinalysis, Complete w Microscopic    Standing Status:   Standing    Number of Occurrences:   1  . CBC with Differential    Standing Status:   Standing    Number of Occurrences:   1  . Basic metabolic panel    Standing Status:   Standing    Number of Occurrences:   1  . Digoxin level    Standing Status:   Standing    Number of Occurrences:   1  . ED EKG    afib    Standing Status:   Standing    Number of Occurrences:   1    Order Specific Question:   Reason for Exam    Answer:   Other (See Comments)  . EKG 12-Lead    Standing Status:   Standing    Number of Occurrences:   1    Results for orders placed or performed during the hospital encounter of 06/03/18 (from the past 24 hour(s))  Urinalysis, Complete w Microscopic     Status: Abnormal   Collection Time: 06/03/18  8:32 AM  Result Value Ref Range   Color, Urine YELLOW YELLOW   APPearance HAZY (A) CLEAR   Specific Gravity, Urine 1.010 1.005 - 1.030   pH 5.5 5.0 - 8.0   Glucose, UA NEGATIVE NEGATIVE mg/dL   Hgb urine dipstick NEGATIVE NEGATIVE   Bilirubin Urine NEGATIVE NEGATIVE   Ketones, ur NEGATIVE NEGATIVE mg/dL   Protein, ur NEGATIVE NEGATIVE mg/dL   Nitrite NEGATIVE NEGATIVE   Leukocytes, UA SMALL (A) NEGATIVE   Squamous Epithelial / LPF 0-5 0 - 5   Non Squamous Epithelial PRESENT (A) NONE SEEN   WBC, UA 21-50 0 - 5 WBC/hpf   RBC / HPF NONE SEEN 0 - 5 RBC/hpf   Bacteria, UA RARE (A) NONE SEEN   Mucus PRESENT    Hyaline Casts, UA PRESENT   CBC with Differential     Status: Abnormal   Collection Time: 06/03/18  9:16 AM  Result Value Ref Range   WBC 7.6 3.6 - 11.0 K/uL   RBC 3.93 3.80 - 5.20 MIL/uL   Hemoglobin 11.7 (L) 12.0 - 16.0 g/dL   HCT 16.1 (L) 09.6 - 04.5 %    MCV 88.4 80.0 - 100.0 fL   MCH 29.7 26.0 - 34.0 pg   MCHC 33.6 32.0 - 36.0 g/dL   RDW 40.9 (H) 81.1 - 91.4 %   Platelets 375 150 - 440 K/uL   Neutrophils Relative % 64 %   Neutro Abs 4.9 1.4 - 6.5 K/uL   Lymphocytes  Relative 28 %   Lymphs Abs 2.1 1.0 - 3.6 K/uL   Monocytes Relative 5 %   Monocytes Absolute 0.4 0.2 - 0.9 K/uL   Eosinophils Relative 2 %   Eosinophils Absolute 0.2 0 - 0.7 K/uL   Basophils Relative 1 %   Basophils Absolute 0.0 0 - 0.1 K/uL  Basic metabolic panel     Status: Abnormal   Collection Time: 06/03/18  9:16 AM  Result Value Ref Range   Sodium 132 (L) 135 - 145 mmol/L   Potassium 4.8 3.5 - 5.1 mmol/L   Chloride 95 (L) 98 - 111 mmol/L   CO2 27 22 - 32 mmol/L   Glucose, Bld 285 (H) 70 - 99 mg/dL   BUN 20 8 - 23 mg/dL   Creatinine, Ser 1.61 (H) 0.44 - 1.00 mg/dL   Calcium 9.4 8.9 - 09.6 mg/dL   GFR calc non Af Amer 32 (L) >60 mL/min   GFR calc Af Amer 37 (L) >60 mL/min   Anion gap 10 5 - 15  Digoxin level     Status: Abnormal   Collection Time: 06/03/18  9:16 AM  Result Value Ref Range   Digoxin Level <0.2 (L) 0.8 - 2.0 ng/mL   No results found.  ED Clinical Impression  Subacute confusional state   ED Assessment/Plan  Previous labs, records reviewed.  As noted in HPI.  Checking EKG, CBC, BMP, digoxin level to rule out renal failure, acute digoxin toxicity.  Last digoxin dose was 3 hours ago.  Will call daughter if abnormal levels.  However this could be chronic digoxin toxicity, and discussed with patient and daughter that she needs close follow-up.  Will give primary care referral list.  Do not think that her symptoms have anything to do with the fall, since she was having symptoms prior to the fall..   EKG: Atrial fibrillation, rate 97.  Normal axis.  No hypertrophy.  No ST-T wave changes suggestive of ischemia.  EKG from 9/18.  UA with small leukocytes, rare bacteria.  Since her last urine culture was negative, and she just finished a week of  Keflex, do not think that this is a UTI.  Will not send this off for culture.   Labs reviewed.  Slightly elevated creatinine compared to the creatinine 6 days ago, was 1.19.  Suspect mild dehydration as the patient is not drinking as much as she normally does.  Advised her to push fluids.  Her glucose is elevated, but was elevated last time.  Otherwise the BMP is normal.  Hemoglobin stable.  Digoxin level pending.  We will call daughter Ronin Crager at [phone] if elevated.  Digoxin level negative.  Discussed this result with daughter.  Daughter states that on further reflection she has remembered that the patient is not been taking the digoxin over the past 2 or 3 months.  Suspect early dementia, particularly since daughter states that the patient is down here due to being unable to take care of herself.  Provided primary care referral list for ongoing care.  Advised follow-up as soon as possible.  Discussed labs, MDM, treatment plan, and plan for follow-up with patient and daughter. Discussed sn/sx that should prompt return to the ED. They agree with plan.   No orders of the defined types were placed in this encounter.   *This clinic note was created using Dragon dictation software. Therefore, there may be occasional mistakes despite careful proofreading.   ?   Domenick Gong, MD 06/03/18 1719

## 2018-07-18 DIAGNOSIS — Z0000 Encounter for general adult medical examination without abnormal findings: Secondary | ICD-10-CM | POA: Diagnosis not present

## 2018-07-18 DIAGNOSIS — I4891 Unspecified atrial fibrillation: Secondary | ICD-10-CM | POA: Diagnosis not present

## 2018-07-18 DIAGNOSIS — N390 Urinary tract infection, site not specified: Secondary | ICD-10-CM | POA: Diagnosis not present

## 2018-07-18 DIAGNOSIS — H401131 Primary open-angle glaucoma, bilateral, mild stage: Secondary | ICD-10-CM | POA: Diagnosis not present

## 2018-07-18 DIAGNOSIS — H35373 Puckering of macula, bilateral: Secondary | ICD-10-CM | POA: Diagnosis not present

## 2018-07-18 DIAGNOSIS — H26493 Other secondary cataract, bilateral: Secondary | ICD-10-CM | POA: Diagnosis not present

## 2018-07-18 DIAGNOSIS — I10 Essential (primary) hypertension: Secondary | ICD-10-CM | POA: Diagnosis not present

## 2018-07-18 DIAGNOSIS — E113592 Type 2 diabetes mellitus with proliferative diabetic retinopathy without macular edema, left eye: Secondary | ICD-10-CM | POA: Diagnosis not present

## 2018-07-18 DIAGNOSIS — H6691 Otitis media, unspecified, right ear: Secondary | ICD-10-CM | POA: Diagnosis not present

## 2018-07-18 DIAGNOSIS — Z6826 Body mass index (BMI) 26.0-26.9, adult: Secondary | ICD-10-CM | POA: Diagnosis not present

## 2018-07-18 DIAGNOSIS — E119 Type 2 diabetes mellitus without complications: Secondary | ICD-10-CM | POA: Diagnosis not present

## 2018-07-19 DIAGNOSIS — N390 Urinary tract infection, site not specified: Secondary | ICD-10-CM | POA: Diagnosis not present

## 2018-08-20 ENCOUNTER — Encounter: Payer: Self-pay | Admitting: Gynecology

## 2018-08-20 ENCOUNTER — Other Ambulatory Visit: Payer: Self-pay

## 2018-08-20 ENCOUNTER — Ambulatory Visit
Admission: EM | Admit: 2018-08-20 | Discharge: 2018-08-20 | Disposition: A | Discharge status: Home / Self Care | Payer: Medicare Other | Attending: Emergency Medicine | Admitting: Emergency Medicine

## 2018-08-20 DIAGNOSIS — Z79899 Other long term (current) drug therapy: Secondary | ICD-10-CM | POA: Insufficient documentation

## 2018-08-20 DIAGNOSIS — N3001 Acute cystitis with hematuria: Secondary | ICD-10-CM

## 2018-08-20 DIAGNOSIS — I4891 Unspecified atrial fibrillation: Secondary | ICD-10-CM | POA: Insufficient documentation

## 2018-08-20 DIAGNOSIS — I10 Essential (primary) hypertension: Secondary | ICD-10-CM | POA: Diagnosis not present

## 2018-08-20 DIAGNOSIS — Z794 Long term (current) use of insulin: Secondary | ICD-10-CM | POA: Insufficient documentation

## 2018-08-20 DIAGNOSIS — Z87440 Personal history of urinary (tract) infections: Secondary | ICD-10-CM | POA: Diagnosis present

## 2018-08-20 DIAGNOSIS — Z7901 Long term (current) use of anticoagulants: Secondary | ICD-10-CM | POA: Insufficient documentation

## 2018-08-20 DIAGNOSIS — B9689 Other specified bacterial agents as the cause of diseases classified elsewhere: Secondary | ICD-10-CM | POA: Insufficient documentation

## 2018-08-20 DIAGNOSIS — F0390 Unspecified dementia without behavioral disturbance: Secondary | ICD-10-CM | POA: Diagnosis present

## 2018-08-20 DIAGNOSIS — E119 Type 2 diabetes mellitus without complications: Secondary | ICD-10-CM | POA: Diagnosis not present

## 2018-08-20 HISTORY — DX: Essential (primary) hypertension: I10

## 2018-08-20 LAB — URINALYSIS, COMPLETE (UACMP) WITH MICROSCOPIC
Bacteria, UA: NONE SEEN
Bilirubin Urine: NEGATIVE
Glucose, UA: NEGATIVE mg/dL
Ketones, ur: NEGATIVE mg/dL
Nitrite: NEGATIVE
Protein, ur: NEGATIVE mg/dL
RBC / HPF: NONE SEEN RBC/hpf (ref 0–5)
Specific Gravity, Urine: 1.01 (ref 1.005–1.030)
Squamous Epithelial / LPF: NONE SEEN (ref 0–5)
pH: 6 (ref 5.0–8.0)

## 2018-08-20 MED ORDER — CEPHALEXIN 500 MG PO CAPS: 500.0000 mg | ORAL_CAPSULE | Freq: Two times a day (BID) | ORAL | 0 refills | Status: AC

## 2018-08-20 NOTE — ED Provider Notes (Signed)
[facility]    CSN: [id number] Arrival date & time: 08/20/18  0841     History   Chief Complaint No chief complaint on file.   HPI Jenna Odom is a 77 y.o. female. Patient with dementia presents with daughter today for "acting differently and urinating less." Her daughter says that she gets UTIs every 3-4 months and she can usually tell because her mother begins to become more agitated and seems uncomfortable urinating. Patient denies any pain with urination. She recently got over a cough and cold. They deny fever, sore throat, cough, congestion, chest discomfort, breathing problems, abdominal pain, changes in BMs, and fatigue. She has not taken any OTC meds for symptoms.   HPI  Past Medical History:  Diagnosis Date  . A-fib (HCC)   . Anxiety   . Diabetes mellitus without complication (HCC)   . Hypertension     There are no active problems to display for this patient.   Past Surgical History:  Procedure Laterality Date  . ABDOMINAL HYSTERECTOMY      OB History   No obstetric history on file.      Home Medications    Prior to Admission medications   Medication Sig Start Date End Date Taking? Authorizing Provider  apixaban (ELIQUIS) 5 MG TABS tablet Take 5 mg by mouth daily.   Yes [provider]  busPIRone (BUSPAR) 15 MG tablet Take 15 mg by mouth 3 (three) times daily.   Yes [provider]  digoxin (LANOXIN) 0.25 MG tablet Take 0.25 mg by mouth daily.   Yes [provider]  glimepiride (AMARYL) 4 MG tablet Take 4 mg by mouth daily with breakfast.   Yes [provider]  insulin aspart protamine- aspart (NOVOLOG MIX 70/30) (70-30) 100 UNIT/ML injection Inject into the skin.   Yes [provider]  metFORMIN (GLUCOPHAGE) 1000 MG tablet Take 1,000 mg by mouth 2 (two) times daily with a meal.   Yes [provider]  metoprolol tartrate (LOPRESSOR) 25 MG tablet  05/31/18  Yes [provider]    traMADol (ULTRAM) 50 MG tablet Take 1 tablet (50 mg total) by mouth every 6 (six) hours as needed. 05/24/18 05/24/19 Yes Emily FilbertWilliams, Jonathan E, MD  cephALEXin (KEFLEX) 500 MG capsule Take 1 capsule (500 mg total) by mouth 2 (two) times daily for 7 days. 08/20/18 08/27/18  Eusebio FriendlyEaves,  B, PA-C  glyBURIDE (DIABETA) 5 MG tablet Take by mouth.    [provider]    Family History Family History  Problem Relation Age of Onset  . Dementia Mother     Social History Social History   Tobacco Use  . Smoking status: Never Smoker  . Smokeless tobacco: Never Used  Substance Use Topics  . Alcohol use: Never    Frequency: Never  . Drug use: Never     Allergies   Ciprofloxacin   Review of Systems Review of Systems  Constitutional: Negative for appetite change, fatigue and fever.  HENT: Negative for congestion, ear pain, rhinorrhea and sore throat.   Respiratory: Negative for cough.   Cardiovascular: Negative for chest pain and leg swelling.  Gastrointestinal: Negative for abdominal pain, constipation, diarrhea and vomiting.  Genitourinary: Positive for decreased urine volume and difficulty urinating. Negative for flank pain, hematuria, pelvic pain, urgency and vaginal discharge.  Musculoskeletal: Negative for back pain and myalgias.  Skin: Negative for color change and rash.  Neurological: Negative for dizziness, facial asymmetry, weakness, numbness and headaches.  Psychiatric/Behavioral: Positive for  agitation and confusion.     Physical Exam Triage Vital Signs ED Triage Vitals  Enc Vitals Group     BP 08/20/18 0910 (!) 151/73     Pulse Rate 08/20/18 0910 60     Resp 08/20/18 0910 16     Temp 08/20/18 0910 98.2 F (36.8 C)     Temp Source 08/20/18 0910 Oral     SpO2 08/20/18 0910 100 %     Weight 08/20/18 0911 144 lb (65.3 kg)     Height 08/20/18 0911 5\' 3"  (1.6 m)     Head Circumference --      Peak Flow --      Pain Score 08/20/18 0911 0     Pain Loc --       Pain Edu? --      Excl. in GC? --    No data found.  Updated Vital Signs BP (!) 151/73 (BP Location: Left Arm)   Pulse 60   Temp 98.2 F (36.8 C) (Oral)   Resp 16   Ht 5\' 3"  (1.6 m)   Wt 144 lb (65.3 kg)   SpO2 100%   BMI 25.51 kg/m      Physical Exam Vitals signs and nursing note reviewed.  Constitutional:      General: She is not in acute distress.    Appearance: She is normal weight. She is not ill-appearing.  HENT:     Head: Normocephalic and atraumatic.     Nose: Nose normal. No congestion or rhinorrhea.     Mouth/Throat:     Mouth: Mucous membranes are moist.     Pharynx: No oropharyngeal exudate or posterior oropharyngeal erythema.  Eyes:     General: No scleral icterus.    Conjunctiva/sclera: Conjunctivae normal.     Pupils: Pupils are equal, round, and reactive to light.  Cardiovascular:     Rate and Rhythm: Normal rate.     Heart sounds: Normal heart sounds. No murmur.  Pulmonary:     Effort: Pulmonary effort is normal. No respiratory distress.     Breath sounds: Normal breath sounds. No wheezing or rhonchi.  Abdominal:     General: There is no distension.     Palpations: Abdomen is soft.     Tenderness: There is no abdominal tenderness. There is no right CVA tenderness, left CVA tenderness or guarding.  Lymphadenopathy:     Cervical: No cervical adenopathy.  Skin:    General: Skin is warm and dry.     Findings: No rash.  Neurological:     General: No focal deficit present.     Mental Status: She is alert.     Cranial Nerves: No cranial nerve deficit.     Motor: No weakness.  Psychiatric:        Mood and Affect: Mood normal.        Behavior: Behavior normal.      UC Treatments / Results  Labs (all labs ordered are listed, but only abnormal results are displayed) Labs Reviewed  URINE CULTURE - Abnormal; Notable for the following components:      Result Value   Culture   (*)    Value: 20,000 COLONIES/mL MULTIPLE SPECIES PRESENT, SUGGEST  RECOLLECTION   All other components within normal limits  URINALYSIS, COMPLETE (UACMP) WITH MICROSCOPIC - Abnormal; Notable for the following components:   Color, Urine STRAW (*)    Hgb urine dipstick TRACE (*)    Leukocytes, UA TRACE (*)    All  other components within normal limits    EKG None  Radiology No results found.  Procedures Procedures (including critical care time)  Medications Ordered in UC Medications - No data to display  Initial Impression / Assessment and Plan / UC Course  I have reviewed the triage vital signs and the nursing notes.  Pertinent labs & imaging results that were available during my care of the patient were reviewed by me and considered in my medical decision making (see chart for details).  Clinical Course as of Aug 23 1143  Sat Aug 20, 2018  0915 Urinalysis, Complete w Microscopic [LE]    Clinical Course User Index [LE] Shirlee Latch, PA-C     Final Clinical Impressions(s) / UC Diagnoses   Final diagnoses:  Acute cystitis with hematuria  Essential hypertension     Discharge Instructions     UTI: Based on presentation and urinalysis results, will treat for UTI with Keflex. Complete course Urine has been sent for culture.Symptoms should resolve within 2-3 days. If they do not, call our office or return for re-examination. We will call with urine culture results in a few days. Drink plenty of water and rest. F/u with PCP at next available appointment.     ED Prescriptions    Medication Sig Dispense Auth. Provider   cephALEXin (KEFLEX) 500 MG capsule Take 1 capsule (500 mg total) by mouth 2 (two) times daily for 7 days. 14 capsule Shirlee Latch, PA-C     Controlled Substance Prescriptions [place] Controlled Substance Registry consulted? Not Applicable   Gareth Morgan 08/22/18 1145

## 2018-08-20 NOTE — ED Triage Notes (Signed)
Per daughter , her  mom urinating less  And with confusion x 3-4 days.

## 2018-08-20 NOTE — Discharge Instructions (Addendum)
UTI: Based on presentation and urinalysis results, will treat for UTI with Keflex. Complete course Urine has been sent for culture.Symptoms should resolve within 2-3 days. If they do not, call our office or return for re-examination. We will call with urine culture results in a few days. Drink plenty of water and rest. F/u with PCP at next available appointment especially regarding elevated blood pressure.

## 2018-08-22 LAB — URINE CULTURE: Culture: 20000 — AB

## 2018-09-25 ENCOUNTER — Other Ambulatory Visit: Payer: Self-pay

## 2018-09-25 ENCOUNTER — Encounter: Payer: Self-pay | Admitting: Emergency Medicine

## 2018-09-25 ENCOUNTER — Ambulatory Visit
Admission: EM | Admit: 2018-09-25 | Discharge: 2018-09-25 | Disposition: A | Discharge status: Home / Self Care | Payer: Medicare Other | Attending: Family Medicine | Admitting: Family Medicine

## 2018-09-25 DIAGNOSIS — F05 Delirium due to known physiological condition: Secondary | ICD-10-CM

## 2018-09-25 DIAGNOSIS — R410 Disorientation, unspecified: Secondary | ICD-10-CM

## 2018-09-25 DIAGNOSIS — N390 Urinary tract infection, site not specified: Secondary | ICD-10-CM | POA: Diagnosis not present

## 2018-09-25 LAB — URINALYSIS, COMPLETE (UACMP) WITH MICROSCOPIC
BILIRUBIN URINE: NEGATIVE
GLUCOSE, UA: NEGATIVE mg/dL
HGB URINE DIPSTICK: NEGATIVE
Ketones, ur: NEGATIVE mg/dL
NITRITE: NEGATIVE
PH: 6.5 (ref 5.0–8.0)
Protein, ur: NEGATIVE mg/dL
Specific Gravity, Urine: 1.01 (ref 1.005–1.030)

## 2018-09-25 MED ORDER — CEPHALEXIN 500 MG PO CAPS: 500.0000 mg | ORAL_CAPSULE | Freq: Two times a day (BID) | ORAL | 0 refills | Status: DC

## 2018-09-25 NOTE — Discharge Instructions (Signed)
Drink lots of water Follow up with primary care provider

## 2018-09-25 NOTE — ED Provider Notes (Signed)
[facility]    CSN: [id number] Arrival date & time: 09/25/18  0802     History   Chief Complaint Chief Complaint  Patient presents with  . Altered Sleep Pattern    HPI Jenna Odom is a 78 y.o. female.   78 yo female accompanied by daughter with a c/o interrupted, restless sleep for the past 2-3 days as well as "don't feel good". Patient denies any pain. No fevers, chills, vomiting, diarrhea or other symptoms.   The history is provided by the patient.    Past Medical History:  Diagnosis Date  . A-fib (HCC)   . Anxiety   . Diabetes mellitus without complication (HCC)   . Hypertension     There are no active problems to display for this patient.   Past Surgical History:  Procedure Laterality Date  . ABDOMINAL HYSTERECTOMY      OB History   No obstetric history on file.      Home Medications    Prior to Admission medications   Medication Sig Start Date End Date Taking? Authorizing Provider  apixaban (ELIQUIS) 5 MG TABS tablet Take 5 mg by mouth daily.   Yes [provider]  busPIRone (BUSPAR) 15 MG tablet Take 15 mg by mouth 3 (three) times daily.   Yes [provider]  digoxin (LANOXIN) 0.25 MG tablet Take 0.25 mg by mouth daily.   Yes [provider]  glimepiride (AMARYL) 4 MG tablet Take 4 mg by mouth daily with breakfast.   Yes [provider]  glyBURIDE (DIABETA) 5 MG tablet Take by mouth.   Yes [provider]  insulin aspart protamine- aspart (NOVOLOG MIX 70/30) (70-30) 100 UNIT/ML injection Inject into the skin.   Yes [provider]  metFORMIN (GLUCOPHAGE) 1000 MG tablet Take 1,000 mg by mouth 2 (two) times daily with a meal.   Yes [provider]  metoprolol tartrate (LOPRESSOR) 25 MG tablet  05/31/18  Yes [provider]  cephALEXin (KEFLEX) 500 MG capsule Take 1 capsule (500 mg total) by mouth 2 (two) times daily. 09/25/18   Payton Mccallum, MD  traMADol (ULTRAM) 50  MG tablet Take 1 tablet (50 mg total) by mouth every 6 (six) hours as needed. 05/24/18 05/24/19  Emily Filbert, MD    Family History Family History  Problem Relation Age of Onset  . Dementia Mother     Social History Social History   Tobacco Use  . Smoking status: Never Smoker  . Smokeless tobacco: Never Used  Substance Use Topics  . Alcohol use: Never    Frequency: Never  . Drug use: Never     Allergies   Ciprofloxacin   Review of Systems Review of Systems   Physical Exam Triage Vital Signs ED Triage Vitals  Enc Vitals Group     BP 09/25/18 0815 (!) 156/67     Pulse Rate 09/25/18 0815 60     Resp 09/25/18 0815 16     Temp 09/25/18 0815 97.8 F (36.6 C)     Temp Source 09/25/18 0815 Oral     SpO2 09/25/18 0815 100 %     Weight 09/25/18 0812 144 lb (65.3 kg)     Height 09/25/18 0812 5\' 3"  (1.6 m)     Head Circumference --      Peak Flow --      Pain Score 09/25/18 0812 0     Pain Loc --      Pain Edu? --  Excl. in GC? --    No data found.  Updated Vital Signs BP (!) 156/67 (BP Location: Left Arm)   Pulse 60   Temp 97.8 F (36.6 C) (Oral)   Resp 16   Ht 5\' 3"  (1.6 m)   Wt 65.3 kg   SpO2 100%   BMI 25.51 kg/m   Visual Acuity Right Eye Distance:   Left Eye Distance:   Bilateral Distance:    Right Eye Near:   Left Eye Near:    Bilateral Near:     Physical Exam Vitals signs and nursing note reviewed.  Constitutional:      General: She is not in acute distress.    Appearance: Normal appearance. She is not ill-appearing, toxic-appearing or diaphoretic.  Abdominal:     General: There is no distension.     Palpations: Abdomen is soft.  Neurological:     General: No focal deficit present.     Mental Status: She is alert. Mental status is at baseline.      UC Treatments / Results  Labs (all labs ordered are listed, but only abnormal results are displayed) Labs Reviewed  URINALYSIS, COMPLETE (UACMP) WITH MICROSCOPIC - Abnormal;  Notable for the following components:      Result Value   Leukocytes, UA TRACE (*)    Bacteria, UA RARE (*)    All other components within normal limits    EKG None  Radiology No results found.  Procedures Procedures (including critical care time)  Medications Ordered in UC Medications - No data to display  Initial Impression / Assessment and Plan / UC Course  I have reviewed the triage vital signs and the nursing notes.  Pertinent labs & imaging results that were available during my care of the patient were reviewed by me and considered in my medical decision making (see chart for details).      Final Clinical Impressions(s) / UC Diagnoses   Final diagnoses:  Urinary tract infection without hematuria, site unspecified  Subacute confusional state     Discharge Instructions     Drink lots of water Follow up with primary care provider    ED Prescriptions    Medication Sig Dispense Auth. Provider   cephALEXin (KEFLEX) 500 MG capsule Take 1 capsule (500 mg total) by mouth 2 (two) times daily. 10 capsule Payton Mccallum, MD      1. Labs results and diagnosis reviewed with patient and daughter 2. rx as per orders above; reviewed possible side effects, interactions, risks and benefits  3. Recommend supportive treatment as above  4. Follow-up prn if symptoms worsen or don't improve  Controlled Substance Prescriptions [place] Controlled Substance Registry consulted? Not Applicable   Payton Mccallum, MD 09/25/18 1053

## 2018-09-25 NOTE — ED Triage Notes (Signed)
Patient states that she has been waking up more than usual during the night and states that she just "don't feel good"  Patient states that she has Dementia and that she does not have any pain.

## 2018-10-18 DIAGNOSIS — I10 Essential (primary) hypertension: Secondary | ICD-10-CM | POA: Diagnosis not present

## 2018-10-18 DIAGNOSIS — F419 Anxiety disorder, unspecified: Secondary | ICD-10-CM | POA: Diagnosis not present

## 2018-10-18 DIAGNOSIS — I4891 Unspecified atrial fibrillation: Secondary | ICD-10-CM | POA: Diagnosis not present

## 2018-10-18 DIAGNOSIS — N390 Urinary tract infection, site not specified: Secondary | ICD-10-CM | POA: Diagnosis not present

## 2018-10-18 DIAGNOSIS — E119 Type 2 diabetes mellitus without complications: Secondary | ICD-10-CM | POA: Diagnosis not present

## 2018-11-24 ENCOUNTER — Other Ambulatory Visit: Payer: Self-pay

## 2018-11-24 ENCOUNTER — Ambulatory Visit
Admission: EM | Admit: 2018-11-24 | Discharge: 2018-11-24 | Disposition: A | Discharge status: Home / Self Care | Payer: Medicare Other | Attending: Family Medicine | Admitting: Family Medicine

## 2018-11-24 DIAGNOSIS — R350 Frequency of micturition: Secondary | ICD-10-CM | POA: Insufficient documentation

## 2018-11-24 DIAGNOSIS — R410 Disorientation, unspecified: Secondary | ICD-10-CM | POA: Diagnosis not present

## 2018-11-24 LAB — URINALYSIS, COMPLETE (UACMP) WITH MICROSCOPIC
BACTERIA UA: NONE SEEN
BILIRUBIN URINE: NEGATIVE
GLUCOSE, UA: NEGATIVE mg/dL
Hgb urine dipstick: NEGATIVE
KETONES UR: NEGATIVE mg/dL
NITRITE: NEGATIVE
PH: 5.5 (ref 5.0–8.0)
PROTEIN: NEGATIVE mg/dL
Specific Gravity, Urine: 1.01 (ref 1.005–1.030)

## 2018-11-24 NOTE — Discharge Instructions (Signed)
No evidence of UTI.  Monitor sugars.  Keep a close eye.  Take care  Dr. Adriana Simas

## 2018-11-24 NOTE — ED Triage Notes (Signed)
Patient presents to [facility] with daughter. Patient daughter states that she has noticed that patient has been calling her the wrong name for a few days. Increased urinary frequency and decreased output. Patient daughter stated that last time this happened she had a bad UTI and was hospitalized.

## 2018-11-24 NOTE — ED Provider Notes (Signed)
[facility]    CSN: [id number] Arrival date & time: 11/24/18  0805  History   Chief Complaint Chief Complaint  Patient presents with   Urinary Frequency   HPI  78 year old female presents for evaluation for potential UTI.  Daughter states that she has dementia.  There are documented encounters of memory loss and mild cognitive impairment.  I am unsure if she carries a true diagnosis of dementia.  Daughter is concerned that she has a UTI.  She states that her blood sugars have been higher recently due to dietary indiscretion.  She is also noted urinary frequency.  No documented fever.  Daughter states that she has times where she is confused.  She has a history of UTI.  No reports of pain.  Mother also states that her "sleeping patterns are off".  No other associated symptoms.  No other complaints.  Of note, none of her recent urine cultures were consistent with UTI.  Hx reviewed and updated as below. Past Medical History:  Diagnosis Date   A-fib (HCC)    Anxiety    Diabetes mellitus without complication (HCC)    Hypertension   Cognitive impairment -  ? Dementia  Past Surgical History:  Procedure Laterality Date   ABDOMINAL HYSTERECTOMY     OB History   No obstetric history on file.    Home Medications    Prior to Admission medications   Medication Sig Start Date End Date Taking? Authorizing Provider  apixaban (ELIQUIS) 5 MG TABS tablet Take 5 mg by mouth daily.   Yes [provider]  busPIRone (BUSPAR) 15 MG tablet Take 15 mg by mouth 3 (three) times daily.   Yes [provider]  digoxin (LANOXIN) 0.25 MG tablet Take 0.25 mg by mouth daily.   Yes [provider]  glimepiride (AMARYL) 4 MG tablet Take 4 mg by mouth daily with breakfast.   Yes [provider]  glyBURIDE (DIABETA) 5 MG tablet Take by mouth.   Yes [provider]  insulin aspart protamine- aspart (NOVOLOG MIX 70/30) (70-30) 100 UNIT/ML injection  Inject into the skin.   Yes [provider]  metFORMIN (GLUCOPHAGE) 1000 MG tablet Take 1,000 mg by mouth 2 (two) times daily with a meal.   Yes [provider]  metoprolol tartrate (LOPRESSOR) 25 MG tablet  05/31/18  Yes [provider]  traMADol (ULTRAM) 50 MG tablet Take 1 tablet (50 mg total) by mouth every 6 (six) hours as needed. 05/24/18 05/24/19 Yes Emily Filbert, MD  cephALEXin (KEFLEX) 500 MG capsule Take 1 capsule (500 mg total) by mouth 2 (two) times daily. 09/25/18   Payton Mccallum, MD   Family History Family History  Problem Relation Age of Onset   Dementia Mother    Social History Social History   Tobacco Use   Smoking status: Never Smoker   Smokeless tobacco: Never Used  Substance Use Topics   Alcohol use: Never    Frequency: Never   Drug use: Never   Allergies   Ciprofloxacin   Review of Systems Review of Systems  Constitutional: Negative for fever.  Genitourinary: Positive for frequency.  Psychiatric/Behavioral: Positive for confusion.   Physical Exam Triage Vital Signs ED Triage Vitals  Enc Vitals Group     BP 11/24/18 0834 (!) 158/71     Pulse Rate 11/24/18 0834 (!) 58     Resp 11/24/18 0834 18     Temp 11/24/18 0834 98.2 F (36.8 C)  Temp Source 11/24/18 0834 Oral     SpO2 11/24/18 0834 100 %     Weight 11/24/18 0832 144 lb (65.3 kg)     Height 11/24/18 0832 5\' 3"  (1.6 m)     Head Circumference --      Peak Flow --      Pain Score 11/24/18 0832 0     Pain Loc --      Pain Edu? --      Excl. in GC? --    Updated Vital Signs BP (!) 158/71 (BP Location: Left Arm)    Pulse (!) 58    Temp 98.2 F (36.8 C) (Oral)    Resp 18    Ht 5\' 3"  (1.6 m)    Wt 65.3 kg    SpO2 100%    BMI 25.51 kg/m   Visual Acuity Right Eye Distance:   Left Eye Distance:   Bilateral Distance:    Right Eye Near:   Left Eye Near:    Bilateral Near:     Physical Exam Vitals signs and nursing note reviewed.  Constitutional:       General: She is not in acute distress.    Appearance: Normal appearance.  HENT:     Head: Normocephalic and atraumatic.  Eyes:     General:        Right eye: No discharge.        Left eye: No discharge.     Conjunctiva/sclera: Conjunctivae normal.  Cardiovascular:     Rate and Rhythm: Regular rhythm. Bradycardia present.  Pulmonary:     Effort: Pulmonary effort is normal.     Breath sounds: Normal breath sounds.  Neurological:     Mental Status: She is alert.  Psychiatric:     Comments: Normal mood & behavior today.    UC Treatments / Results  Labs (all labs ordered are listed, but only abnormal results are displayed) Labs Reviewed  URINALYSIS, COMPLETE (UACMP) WITH MICROSCOPIC - Abnormal; Notable for the following components:      Result Value   Leukocytes,Ua TRACE (*)    All other components within normal limits    EKG None  Radiology No results found.  Procedures Procedures (including critical care time)  Medications Ordered in UC Medications - No data to display  Initial Impression / Assessment and Plan / UC Course  I have reviewed the triage vital signs and the nursing notes.  Pertinent labs & imaging results that were available during my care of the patient were reviewed by me and considered in my medical decision making (see chart for details).    78 year old female presents with urinary frequency and confusion.  She is well-appearing.  She has no evidence of UTI.  Advised close monitoring.  Final Clinical Impressions(s) / UC Diagnoses   Final diagnoses:  Urinary frequency  Confusion     Discharge Instructions     No evidence of UTI.  Monitor sugars.  Keep a close eye.  Take care  Dr. Adriana Simas     ED Prescriptions    None     Controlled Substance Prescriptions [place] Controlled Substance Registry consulted? Not Applicable   Tommie Sams, [place] 11/24/18 9702

## 2019-01-17 ENCOUNTER — Encounter: Payer: Self-pay | Admitting: Family Medicine

## 2019-01-17 ENCOUNTER — Other Ambulatory Visit: Payer: Self-pay

## 2019-01-17 ENCOUNTER — Ambulatory Visit (INDEPENDENT_AMBULATORY_CARE_PROVIDER_SITE_OTHER): Payer: Medicare Other | Admitting: Family Medicine

## 2019-01-17 VITALS — BP 130/80 | HR 72 | Ht 63.0 in | Wt 166.0 lb

## 2019-01-17 DIAGNOSIS — E119 Type 2 diabetes mellitus without complications: Secondary | ICD-10-CM | POA: Diagnosis not present

## 2019-01-17 DIAGNOSIS — G301 Alzheimer's disease with late onset: Secondary | ICD-10-CM | POA: Diagnosis not present

## 2019-01-17 DIAGNOSIS — Z7689 Persons encountering health services in other specified circumstances: Secondary | ICD-10-CM | POA: Diagnosis not present

## 2019-01-17 DIAGNOSIS — Z794 Long term (current) use of insulin: Secondary | ICD-10-CM | POA: Diagnosis not present

## 2019-01-17 DIAGNOSIS — F0280 Dementia in other diseases classified elsewhere without behavioral disturbance: Secondary | ICD-10-CM

## 2019-01-17 DIAGNOSIS — I4811 Longstanding persistent atrial fibrillation: Secondary | ICD-10-CM

## 2019-01-17 MED ORDER — METOPROLOL TARTRATE 25 MG PO TABS: 25.0000 mg | ORAL_TABLET | Freq: Two times a day (BID) | ORAL | 1 refills | Status: DC

## 2019-01-17 MED ORDER — LANTUS SOLOSTAR 100 UNIT/ML ~~LOC~~ SOPN: 15.0000 [IU] | PEN_INJECTOR | Freq: Every day | SUBCUTANEOUS | 5 refills | Status: DC

## 2019-01-17 MED ORDER — APIXABAN 5 MG PO TABS: 5.0000 mg | ORAL_TABLET | Freq: Two times a day (BID) | ORAL | 5 refills | Status: DC

## 2019-01-17 MED ORDER — BUSPIRONE HCL 5 MG PO TABS: 15.0000 mg | ORAL_TABLET | Freq: Two times a day (BID) | ORAL | 5 refills | Status: DC

## 2019-01-17 MED ORDER — METFORMIN HCL 1000 MG PO TABS: 1000.0000 mg | ORAL_TABLET | Freq: Two times a day (BID) | ORAL | 1 refills | Status: DC

## 2019-01-17 NOTE — Progress Notes (Signed)
Date:  01/17/2019   Name:  Jenna Odom   DOB:  Jul 22, 1941   MRN:  [id number]   Chief Complaint: Establish Care; Atrial Fibrillation (needs referral for cardio); and Diabetes  Patient is a 78 year old female who presents for an establishment of care exam. The patient reports the following problems: diabetes.atrial fib/. Health maintenance has been reviewed up to date  Atrial Fibrillation  Presents for follow-up visit. Symptoms are negative for an AICD problem, bradycardia, chest pain, dizziness, hemodynamic instability, hypertension, hypotension, pacemaker problem, palpitations, shortness of breath, syncope, tachycardia and weakness. The symptoms have been stable. Past medical history includes atrial fibrillation.  Diabetes  She presents for her follow-up diabetic visit. She has type 2 diabetes mellitus. Her disease course has been stable. There are no hypoglycemic associated symptoms. Pertinent negatives for hypoglycemia include no dizziness, headaches or nervousness/anxiousness. Pertinent negatives for diabetes include no blurred vision, no chest pain, no fatigue, no foot paresthesias, no foot ulcerations, no polydipsia, no polyphagia, no polyuria, no visual change, no weakness and no weight loss. There are no hypoglycemic complications. Symptoms are stable. There are no diabetic complications. Current diabetic treatment includes oral agent (dual therapy). She is compliant with treatment some of the time. Her weight is stable. She is following a generally healthy diet. Meal planning includes avoidance of concentrated sweets and carbohydrate counting. She participates in exercise intermittently. Her breakfast blood glucose is taken between 8-9 am. Her breakfast blood glucose range is generally 140-180 mg/dl. An ACE inhibitor/angiotensin II receptor blocker is not being taken. She does not see a podiatrist.Eye exam is not current.    Review of Systems  Constitutional: Negative.  Negative for  chills, fatigue, fever, unexpected weight change and weight loss.  HENT: Negative for congestion, ear discharge, ear pain, rhinorrhea, sinus pressure, sneezing and sore throat.   Eyes: Negative for blurred vision, photophobia, pain, discharge, redness and itching.  Respiratory: Negative for cough, shortness of breath, wheezing and stridor.   Cardiovascular: Negative for chest pain, palpitations and syncope.  Gastrointestinal: Negative for abdominal pain, blood in stool, constipation, diarrhea, nausea and vomiting.  Endocrine: Negative for cold intolerance, heat intolerance, polydipsia, polyphagia and polyuria.  Genitourinary: Negative for dysuria, flank pain, frequency, hematuria, menstrual problem, pelvic pain, urgency, vaginal bleeding and vaginal discharge.  Musculoskeletal: Negative for arthralgias, back pain and myalgias.  Skin: Negative for rash.  Allergic/Immunologic: Negative for environmental allergies and food allergies.  Neurological: Negative for dizziness, weakness, light-headedness, numbness and headaches.  Hematological: Negative for adenopathy. Does not bruise/bleed easily.  Psychiatric/Behavioral: Negative for dysphoric mood. The patient is not nervous/anxious.     Patient Active Problem List   Diagnosis Date Noted  . Mild cognitive impairment 04/06/2018    Allergies  Allergen Reactions  . Ciprofloxacin Nausea And Vomiting    Past Surgical History:  Procedure Laterality Date  . ABDOMINAL HYSTERECTOMY      Social History   Tobacco Use  . Smoking status: Never Smoker  . Smokeless tobacco: Never Used  Substance Use Topics  . Alcohol use: Never    Frequency: Never  . Drug use: Never     Medication list has been reviewed and updated.  Current Meds  Medication Sig  . apixaban (ELIQUIS) 5 MG TABS tablet Take 5 mg by mouth 2 (two) times daily. cardio  . busPIRone (BUSPAR) 5 MG tablet Take 15 mg by mouth 2 (two) times daily. 1/2 pill in morning and 1 at night   . glimepiride (AMARYL) 4  MG tablet Take 4 mg by mouth daily with breakfast.  . LANTUS SOLOSTAR 100 UNIT/ML Solostar Pen Inject 15 Units as directed at bedtime.  . metFORMIN (GLUCOPHAGE) 1000 MG tablet Take 1,000 mg by mouth 2 (two) times daily with a meal.  . metoprolol tartrate (LOPRESSOR) 25 MG tablet 25 mg 2 (two) times daily.     PHQ 2/9 Scores 01/17/2019  PHQ - 2 Score 1  PHQ- 9 Score 1    BP Readings from Last 3 Encounters:  01/17/19 130/80  11/24/18 (!) 158/71  09/25/18 (!) 156/67    Physical Exam Vitals signs and nursing note reviewed.  Constitutional:      General: She is not in acute distress.    Appearance: She is not diaphoretic.  HENT:     Head: Normocephalic and atraumatic.     Right Ear: Tympanic membrane and external ear normal.     Left Ear: Tympanic membrane and external ear normal.     Nose: Nose normal.  Eyes:     General:        Right eye: No discharge.        Left eye: No discharge.     Conjunctiva/sclera: Conjunctivae normal.     Pupils: Pupils are equal, round, and reactive to light.  Neck:     Musculoskeletal: Normal range of motion and neck supple.     Thyroid: No thyromegaly.     Vascular: No JVD.  Cardiovascular:     Rate and Rhythm: Normal rate and regular rhythm.     Heart sounds: Normal heart sounds. No murmur. No friction rub. No gallop.   Pulmonary:     Effort: Pulmonary effort is normal.     Breath sounds: Normal breath sounds. No wheezing or rhonchi.  Abdominal:     General: Bowel sounds are normal. There is no distension.     Palpations: Abdomen is soft. There is no mass.     Tenderness: There is no abdominal tenderness. There is no guarding or rebound.     Hernia: No hernia is present.  Musculoskeletal: Normal range of motion.  Lymphadenopathy:     Cervical: No cervical adenopathy.  Skin:    General: Skin is warm and dry.  Neurological:     Mental Status: She is alert.     Deep Tendon Reflexes: Reflexes are normal and  symmetric.     Wt Readings from Last 3 Encounters:  01/17/19 166 lb (75.3 kg)  11/24/18 144 lb (65.3 kg)  09/25/18 144 lb (65.3 kg)    BP 130/80   Pulse 72 Comment: irregular  Ht 5\' 3"  (1.6 m)   Wt 166 lb (75.3 kg)   BMI 29.41 kg/m   Assessment and Plan:  1. Establishing care with new doctor, encounter for Establishment of care with new physician for patient with multiple medical complaints/ concerns.  2. Longstanding persistent atrial fibrillation Patient has a prior in atrial fibrillation for over the past 1-1/2 to 8 years for which Eliquis was initiated.  I am uncertain that the dosing will be continued as well as whether or not it will be necessary to continue therefore we will obtain a cardiology consult to determine if this will be continued in the future.  And a prophylactic dose.  We will continue home metoprolol for rate control. - Ambulatory referral to Cardiology - apixaban (ELIQUIS) 5 MG TABS tablet; Take 1 tablet (5 mg total) by mouth 2 (two) times daily. cardio  Dispense: 60 tablet; Refill: 5 -  metoprolol tartrate (LOPRESSOR) 25 MG tablet; Take 1 tablet (25 mg total) by mouth 2 (two) times daily.  Dispense: 180 tablet; Refill: 1  3. Type 2 diabetes mellitus without complication, with long-term current use of insulin (HCC) Patient has type 2 diabetes for which she is on Lantus which is been in the past been used Maisie Fus as a sliding scale.  In discussion that it is unlikely that this was meant to be used as a sliding scale given that it is a long-acting insulin we have come back to the 15 units once at night and we will start initiating fasting glucose readings in the morning over the next 6 weeks to determine whether we are going to decrease or increase in the meantime given that she has had some episodes of glucose in the 70 range we will discontinue the sulfonylurea and continue only her metformin twice a day.  Dietary plan was given to family member for guidelines as to  what would be proper for the patient to eat for adequate dietary control of diabetes. - LANTUS SOLOSTAR 100 UNIT/ML Solostar Pen; Inject 15 Units into the skin at bedtime.  Dispense: 15 mL; Refill: 5 - metFORMIN (GLUCOPHAGE) 1000 MG tablet; Take 1 tablet (1,000 mg total) by mouth 2 (two) times daily with a meal.  Dispense: 180 tablet; Refill: 1  4. Late onset Alzheimer's disease without behavioral disturbance [facility]) Patient has dementia with some wandering behavior.  Patient is on buspirone probably for anxiety and we will continue at her current dose of one half in the morning and 1 at night although averages 1 twice a day and may do so as necessary.  Will recheck in 6 weeks whether we will continue any of these medications at current dosing. - busPIRone (BUSPAR) 5 MG tablet; Take 3 tablets (15 mg total) by mouth 2 (two) times daily. 1/2 pill in morning and 1 at night  Dispense: 60 tablet; Refill: 5

## 2019-01-17 NOTE — Patient Instructions (Signed)

## 2019-02-07 DIAGNOSIS — I480 Paroxysmal atrial fibrillation: Secondary | ICD-10-CM | POA: Diagnosis not present

## 2019-02-07 DIAGNOSIS — Z794 Long term (current) use of insulin: Secondary | ICD-10-CM | POA: Diagnosis not present

## 2019-02-07 DIAGNOSIS — E118 Type 2 diabetes mellitus with unspecified complications: Secondary | ICD-10-CM | POA: Diagnosis not present

## 2019-02-07 DIAGNOSIS — Z7689 Persons encountering health services in other specified circumstances: Secondary | ICD-10-CM | POA: Diagnosis not present

## 2019-02-07 DIAGNOSIS — I4819 Other persistent atrial fibrillation: Secondary | ICD-10-CM | POA: Diagnosis not present

## 2019-02-07 DIAGNOSIS — N183 Chronic kidney disease, stage 3 (moderate): Secondary | ICD-10-CM | POA: Diagnosis not present

## 2019-02-07 DIAGNOSIS — I10 Essential (primary) hypertension: Secondary | ICD-10-CM | POA: Diagnosis not present

## 2019-02-09 ENCOUNTER — Encounter: Payer: Self-pay | Admitting: Family Medicine

## 2019-02-09 ENCOUNTER — Ambulatory Visit (INDEPENDENT_AMBULATORY_CARE_PROVIDER_SITE_OTHER): Payer: Medicare Other | Admitting: Family Medicine

## 2019-02-09 ENCOUNTER — Other Ambulatory Visit: Payer: Self-pay

## 2019-02-09 VITALS — BP 120/80 | HR 72 | Temp 98.6°F | Ht 63.0 in | Wt 163.0 lb

## 2019-02-09 DIAGNOSIS — M545 Low back pain, unspecified: Secondary | ICD-10-CM

## 2019-02-09 LAB — POCT URINALYSIS DIPSTICK
Bilirubin, UA: NEGATIVE
Blood, UA: NEGATIVE
Glucose, UA: NEGATIVE
Ketones, UA: NEGATIVE
Leukocytes, UA: NEGATIVE
Nitrite, UA: NEGATIVE
Protein, UA: NEGATIVE
Spec Grav, UA: 1.01 (ref 1.010–1.025)
Urobilinogen, UA: 0.2 E.U./dL
pH, UA: 5 (ref 5.0–8.0)

## 2019-02-09 NOTE — Progress Notes (Signed)
Date:  02/09/2019   Name:  Jenna Odom   DOB:  11-07-1940   MRN:  [id number]   Chief Complaint: Urinary Tract Infection (confusion, back hurting)  Urinary Tract Infection   This is a new problem. The current episode started in the past 7 days. The problem occurs intermittently. The problem has been gradually improving. The quality of the pain is described as aching (lower back). The pain is mild. There has been no fever. Pertinent negatives include no chills, discharge, flank pain, frequency, hematuria, hesitancy, nausea, sweats, urgency or vomiting. She has tried nothing for the symptoms. The treatment provided mild relief. There is no history of catheterization, kidney stones, recurrent UTIs, a single kidney, urinary stasis or a urological procedure.    Review of Systems  Constitutional: Negative.  Negative for chills, fatigue, fever and unexpected weight change.  HENT: Negative for congestion, ear discharge, ear pain, mouth sores, nosebleeds, rhinorrhea, sinus pressure, sneezing and sore throat.   Eyes: Negative for photophobia, pain, discharge, redness and itching.  Respiratory: Negative for cough, shortness of breath, wheezing and stridor.   Cardiovascular: Negative for palpitations and leg swelling.  Gastrointestinal: Negative for abdominal pain, blood in stool, constipation, diarrhea, nausea and vomiting.  Endocrine: Negative for cold intolerance, heat intolerance, polydipsia, polyphagia and polyuria.  Genitourinary: Negative for dysuria, flank pain, frequency, hematuria, hesitancy, menstrual problem, pelvic pain, urgency, vaginal bleeding and vaginal discharge.  Musculoskeletal: Negative for arthralgias, back pain and myalgias.  Skin: Negative for rash.  Allergic/Immunologic: Negative for environmental allergies and food allergies.  Neurological: Negative for dizziness, weakness, light-headedness, numbness and headaches.  Hematological: Negative for adenopathy. Does not  bruise/bleed easily.  Psychiatric/Behavioral: Negative for dysphoric mood. The patient is not nervous/anxious.     Patient Active Problem List   Diagnosis Date Noted  . Mild cognitive impairment 04/06/2018    Allergies  Allergen Reactions  . Ciprofloxacin Nausea And Vomiting    Past Surgical History:  Procedure Laterality Date  . ABDOMINAL HYSTERECTOMY      Social History   Tobacco Use  . Smoking status: Never Smoker  . Smokeless tobacco: Never Used  Substance Use Topics  . Alcohol use: Never    Frequency: Never  . Drug use: Never     Medication list has been reviewed and updated.  Current Meds  Medication Sig  . apixaban (ELIQUIS) 5 MG TABS tablet Take 1 tablet (5 mg total) by mouth 2 (two) times daily. cardio  . busPIRone (BUSPAR) 5 MG tablet Take 3 tablets (15 mg total) by mouth 2 (two) times daily. 1/2 pill in morning and 1 at night  . LANTUS SOLOSTAR 100 UNIT/ML Solostar Pen Inject 15 Units into the skin at bedtime.  . metFORMIN (GLUCOPHAGE) 1000 MG tablet Take 1 tablet (1,000 mg total) by mouth 2 (two) times daily with a meal.  . metoprolol tartrate (LOPRESSOR) 25 MG tablet Take 1 tablet (25 mg total) by mouth 2 (two) times daily.    PHQ 2/9 Scores 01/17/2019  PHQ - 2 Score 1  PHQ- 9 Score 1    BP Readings from Last 3 Encounters:  02/09/19 120/80  01/17/19 130/80  11/24/18 (!) 158/71    Physical Exam Vitals signs and nursing note reviewed.  Constitutional:      General: She is not in acute distress.    Appearance: She is not diaphoretic.  HENT:     Head: Normocephalic and atraumatic.     Right Ear: External ear normal.  Left Ear: External ear normal.     Nose: Nose normal. No congestion or rhinorrhea.     Mouth/Throat:     Mouth: Mucous membranes are moist.  Eyes:     General:        Right eye: No discharge.        Left eye: No discharge.     Conjunctiva/sclera: Conjunctivae normal.     Pupils: Pupils are equal, round, and reactive to  light.  Neck:     Musculoskeletal: Normal range of motion and neck supple.     Thyroid: No thyromegaly.     Vascular: No JVD.  Cardiovascular:     Rate and Rhythm: Regular rhythm. Tachycardia present.     Heart sounds: Normal heart sounds. No murmur. No friction rub. No gallop.   Pulmonary:     Effort: Pulmonary effort is normal.     Breath sounds: Normal breath sounds. No wheezing, rhonchi or rales.  Abdominal:     General: Bowel sounds are normal.     Palpations: Abdomen is soft. There is no mass.     Tenderness: There is no abdominal tenderness. There is no guarding.  Musculoskeletal: Normal range of motion.     Lumbar back: She exhibits spasm. She exhibits no tenderness and no bony tenderness.  Lymphadenopathy:     Cervical: No cervical adenopathy.  Skin:    General: Skin is warm and dry.  Neurological:     Mental Status: She is alert.     Deep Tendon Reflexes: Reflexes are normal and symmetric.     Wt Readings from Last 3 Encounters:  02/09/19 163 lb (73.9 kg)  01/17/19 166 lb (75.3 kg)  11/24/18 144 lb (65.3 kg)    BP 120/80   Pulse 72   Temp 98.6 F (37 C) (Oral)   Ht 5\' 3"  (1.6 m)   Wt 163 lb (73.9 kg)   BMI 28.87 kg/m   Assessment and Plan:    1. Acute midline low back pain without sciatica Patient with lower back pain with no urinary tract infection symptoms.  Patient has baseline dementia but there is no significant change in her level of cognition.  Exam is consistent with a lumbar sacral strain.  We have suggested taking Tylenol for the pain with close watch for any urinary tract symptoms. - POCT Urinalysis Dipstick

## 2019-02-09 NOTE — Patient Instructions (Addendum)
High Blood Pressure: Low-Sodium Diet   Many people find that cutting down on sodium lowers their blood pressure. A low-sodium diet limits the amount of sodium in your diet to no more than 2300 milligrams a day. One teaspoon of salt has about 2300 milligrams of sodium.   Our taste for salt is mainly a habit. When you gradually lower the amount of salt in your diet, your taste begins to change. After a while, food begins to taste better without salt than it did with it.   Dietary Recommendations   Table salt added to foods is a common source of sodium in the diet. By not adding salt to foods, you can reduce the amount of sodium in your diet. But sodium is also found in canned and prepared foods, even if they don't taste salty. Learn which foods to avoid by reading labels to find out how much sodium is in the foods. You can reduce the amount of sodium in your diet by following these guidelines:   Read labels carefully. Look for any form of sodium or salt, such as sodium benzoate or sodium citrate. Choose foods that have less salt.  Add very little or no salt to food that you prepare.  Check the sodium content when you use baking powder, baking soda, and monosodium glutamate (MSG).  Do not add salt to food at the table.  Fast foods are very high in salt, as are many other restaurant foods. When you eat at a restaurant, try steamed fish and vegetables or fresh salads. Avoid soups.  Avoid eating the following foods:  ketchup, prepared mustard, pickles, and olives  soy sauce, steak or barbecue sauce, chili sauce, or Worcestershire sauce  bouillon cubes  commercially prepared or cured meats or fish (for example, bacon, luncheon meats, and canned sardines)  canned vegetables, soups, and other packaged convenience foods  salty cheeses and buttermilk  salted nuts and peanut butter  self-rising flour and biscuit mixes  salted crackers, chips, popcorn, and pretzels  commercial salad dressings  instant  cooked cereals.    Many of these foods are now available in unsalted or low-sodium versions. Read all labels carefully.  If your diet must be restricted to much lower amounts of sodium, talk to your health care provider and a registered dietitian for help in planning your meals. It is important to keep your meals nutritionally balanced and tasty. It can be hard to follow a restricted-salt diet if the food doesn't taste good, but there are many healthy ways to add taste without adding salt or fat.   Use of Salt Substitutes   Ask your health care provider about using salt substitutes. Most salt substitutes contain potassium for flavor. If you are taking certain medications, you may need to be careful about the amount of potassium in your diet.        Substitutions and Hints   Season foods with herbs and spices. Use onions, garlic, parsley, lemon and lime juice and rind, dill weed, basil, tarragon, marjoram, thyme, curry powder, turmeric, cumin, paprika, vinegar, or wine to enhance the flavor and aroma of foods. Mushrooms, celery, red pepper, yellow pepper, green pepper, and dried fruits also enhance specific dishes.  Eat fresh foods (instead of canned or packaged foods) as much as possible. Also, plain frozen fruits and vegetables usually do not have added salt.  Add a pinch of sugar or a squeeze of lemon juice to bring out the flavor in fresh vegetables.  If you must   use canned products, use the low-sodium types (except for fruit). Rinse canned vegetables with tap water before cooking.  Substitute unsalted, polyunsaturated margarine for regular margarine or butter.  Eat low-sodium cheeses. Many are available now, some with herbs and spices that are very tasty, and many are also low-fat.  Drink low-sodium juices.  Make unsalted or lightly salted soup stocks and keep them in the freezer to use as substitutes for canned broth and bouillon. Use these stocks to enhance vegetables.  Substitute  wines and vinegars (especially the flavored vinegars) for salt to enhance flavors.  Eat tuna and salmon and rinse first with running water.  Use herbs such as bay leaf, curry, turmeric, cumin, cilantro, dill, marjoram, paprika, pepper, tarragon, thyme, sage, onions, or garlic to season chicken, beef, or fish.  Cook rice in homemade broth with mushrooms and scallions or shallots.  Help Yourself Become Healthier   Check food labels for sodium and fat content.  Read nutrition information available at your [place] [employer] library, from the [employer], and through nutrition programs and health fairs. Ask your health care provider for printed information on nutrition, diet, and health.  Contact a dietitian for information.  Look for some of the excellent low-sodium cookbooks available in most bookstores.  Take time to plan and enjoy your meals. You will be pleasantly surprised at how fast you learn new food preparations, how lowering your sodium intake lowers your blood pressure, and how good food can be.  Acute Back Pain, Adult Acute back pain is sudden and usually short-lived. It is often caused by an injury to the muscles and tissues in the back. The injury may result from: A muscle or ligament getting overstretched or torn (strained). Ligaments are tissues that connect bones to each other. Lifting something improperly can cause a back strain. Wear and tear (degeneration) of the spinal disks. Spinal disks are circular tissue that provides cushioning between the bones of the spine (vertebrae). Twisting motions, such as while playing sports or doing yard work. A hit to the back. Arthritis. You may have a physical exam, lab tests, and imaging tests to find the cause of your pain. Acute back pain usually goes away with rest and home care. Follow these instructions at home: Managing pain, stiffness, and swelling Take over-the-counter and prescription medicines only as told by your health care  provider. Your health care provider may recommend applying ice during the first 24-48 hours after your pain starts. To do this: Put ice in a plastic bag. Place a towel between your skin and the bag. Leave the ice on for 20 minutes, 2-3 times a day. If directed, apply heat to the affected area as often as told by your health care provider. Use the heat source that your health care provider recommends, such as a moist heat pack or a heating pad. Place a towel between your skin and the heat source. Leave the heat on for 20-30 minutes. Remove the heat if your skin turns bright red. This is especially important if you are unable to feel pain, heat, or cold. You have a greater risk of getting burned. Activity  Do not stay in bed. Staying in bed for more than 1-2 days can delay your recovery. Sit up and stand up straight. Avoid leaning forward when you sit, or hunching over when you stand. If you work at a desk, sit close to it so you do not need to lean over. Keep your chin tucked in. Keep your neck drawn  back, and keep your elbows bent at a right angle. Your arms should look like the letter "L." Sit high and close to the steering wheel when you drive. Add lower back (lumbar) support to your car seat, if needed. Take short walks on even surfaces as soon as you are able. Try to increase the length of time you walk each day. Do not sit, drive, or stand in one place for more than 30 minutes at a time. Sitting or standing for long periods of time can put stress on your back. Do not drive or use heavy machinery while taking prescription pain medicine. Use proper lifting techniques. When you bend and lift, use positions that put less stress on your back: [place] your knees. Keep the load close to your body. Avoid twisting. Exercise regularly as told by your health care provider. Exercising helps your back heal faster and helps prevent back injuries by keeping muscles strong and flexible. Work with a physical  therapist to make a safe exercise program, as recommended by your health care provider. Do any exercises as told by your physical therapist. Lifestyle Maintain a healthy weight. Extra weight puts stress on your back and makes it difficult to have good posture. Avoid activities or situations that make you feel anxious or stressed. Stress and anxiety increase muscle tension and can make back pain worse. Learn ways to manage anxiety and stress, such as through exercise. General instructions Sleep on a firm mattress in a comfortable position. Try lying on your side with your knees slightly bent. If you lie on your back, put a pillow under your knees. Follow your treatment plan as told by your health care provider. This may include: Cognitive or behavioral therapy. Acupuncture or massage therapy. Meditation or yoga. Contact a health care provider if: You have pain that is not relieved with rest or medicine. You have increasing pain going down into your legs or buttocks. Your pain does not improve after 2 weeks. You have pain at night. You lose weight without trying. You have a fever or chills. Get help right away if: You develop new bowel or bladder control problems. You have unusual weakness or numbness in your arms or legs. You develop nausea or vomiting. You develop abdominal pain. You feel faint. Summary Acute back pain is sudden and usually short-lived. Use proper lifting techniques. When you bend and lift, use positions that put less stress on your back. Take over-the-counter and prescription medicines and apply heat or ice as directed by your health care provider. This information is not intended to replace advice given to you by your health care provider. Make sure you discuss any questions you have with your health care provider. Document Released: 08/24/2005 Document Revised: 03/31/2018 Document Reviewed: 04/07/2017 Elsevier Interactive Patient Education  2019 [employer].

## 2019-02-20 DIAGNOSIS — I4819 Other persistent atrial fibrillation: Secondary | ICD-10-CM | POA: Diagnosis not present

## 2019-02-20 DIAGNOSIS — I10 Essential (primary) hypertension: Secondary | ICD-10-CM | POA: Diagnosis not present

## 2019-02-28 ENCOUNTER — Other Ambulatory Visit: Payer: Self-pay

## 2019-02-28 ENCOUNTER — Encounter: Payer: Self-pay | Admitting: Family Medicine

## 2019-02-28 ENCOUNTER — Ambulatory Visit (INDEPENDENT_AMBULATORY_CARE_PROVIDER_SITE_OTHER): Payer: Medicare Other | Admitting: Family Medicine

## 2019-02-28 VITALS — BP 130/70 | HR 72 | Ht 63.0 in | Wt 163.0 lb

## 2019-02-28 DIAGNOSIS — E1165 Type 2 diabetes mellitus with hyperglycemia: Secondary | ICD-10-CM | POA: Insufficient documentation

## 2019-02-28 DIAGNOSIS — Z794 Long term (current) use of insulin: Secondary | ICD-10-CM

## 2019-02-28 NOTE — Progress Notes (Signed)
Date:  02/28/2019   Name:  Jenna Odom   DOB:  [birth year]   MRN:  [id number]   Chief Complaint: Diabetes (follow up- avg 140- needs labs)  Diabetes She presents for her follow-up diabetic visit. She has type 2 diabetes mellitus. Her disease course has been stable. There are no hypoglycemic associated symptoms. Pertinent negatives for hypoglycemia include no dizziness, headaches or nervousness/anxiousness. There are no diabetic associated symptoms. Pertinent negatives for diabetes include no blurred vision, no chest pain, no fatigue, no foot paresthesias, no foot ulcerations, no polydipsia, no polyphagia, no polyuria, no visual change, no weakness and no weight loss. There are no hypoglycemic complications. Symptoms are stable. There are no known risk factors for coronary artery disease. Her weight is stable. She is following a generally healthy diet. She participates in exercise intermittently. Her breakfast blood glucose is taken between 8-9 am. Her breakfast blood glucose range is generally 140-180 mg/dl. She does not see a podiatrist.Eye exam is not current.    Review of Systems  Constitutional: Negative for chills, fatigue, fever and weight loss.  HENT: Negative for drooling, ear discharge, ear pain and sore throat.   Eyes: Negative for blurred vision.  Respiratory: Negative for cough, shortness of breath and wheezing.   Cardiovascular: Negative for chest pain, palpitations and leg swelling.  Gastrointestinal: Negative for abdominal pain, blood in stool, constipation, diarrhea and nausea.  Endocrine: Negative for polydipsia, polyphagia and polyuria.  Genitourinary: Negative for dysuria, frequency, hematuria and urgency.  Musculoskeletal: Negative for back pain, myalgias and neck pain.  Skin: Negative for rash.  Allergic/Immunologic: Negative for environmental allergies.  Neurological: Negative for dizziness, weakness and headaches.  Hematological: Does not bruise/bleed easily.   Psychiatric/Behavioral: Negative for suicidal ideas. The patient is not nervous/anxious.     Patient Active Problem List   Diagnosis Date Noted  . Mild cognitive impairment 04/06/2018    Allergies  Allergen Reactions  . Ciprofloxacin Nausea And Vomiting    Past Surgical History:  Procedure Laterality Date  . ABDOMINAL HYSTERECTOMY      Social History   Tobacco Use  . Smoking status: Never Smoker  . Smokeless tobacco: Never Used  Substance Use Topics  . Alcohol use: Never    Frequency: Never  . Drug use: Never     Medication list has been reviewed and updated.  Current Meds  Medication Sig  . apixaban (ELIQUIS) 5 MG TABS tablet Take 1 tablet (5 mg total) by mouth 2 (two) times daily. cardio  . busPIRone (BUSPAR) 5 MG tablet Take 3 tablets (15 mg total) by mouth 2 (two) times daily. 1/2 pill in morning and 1 at night  . LANTUS SOLOSTAR 100 UNIT/ML Solostar Pen Inject 15 Units into the skin at bedtime.  . metFORMIN (GLUCOPHAGE) 1000 MG tablet Take 1 tablet (1,000 mg total) by mouth 2 (two) times daily with a meal.  . metoprolol tartrate (LOPRESSOR) 25 MG tablet Take 1 tablet (25 mg total) by mouth 2 (two) times daily.    PHQ 2/9 Scores 01/17/2019  PHQ - 2 Score 1  PHQ- 9 Score 1    BP Readings from Last 3 Encounters:  02/28/19 130/70  02/09/19 120/80  01/17/19 130/80    Physical Exam Vitals signs and nursing note reviewed.  Constitutional:      General: She is not in acute distress.    Appearance: She is not diaphoretic.  HENT:     Head: Normocephalic and atraumatic.     Right Ear:  Tympanic membrane and external ear normal.     Left Ear: Tympanic membrane and external ear normal.     Nose: Nose normal.  Eyes:     General:        Right eye: No discharge.        Left eye: No discharge.     Conjunctiva/sclera: Conjunctivae normal.     Pupils: Pupils are equal, round, and reactive to light.  Neck:     Musculoskeletal: Normal range of motion and neck  supple.     Thyroid: No thyromegaly.     Vascular: No JVD.  Cardiovascular:     Rate and Rhythm: Normal rate and regular rhythm.     Heart sounds: Normal heart sounds. No murmur. No friction rub. No gallop.   Pulmonary:     Effort: Pulmonary effort is normal.     Breath sounds: Normal breath sounds.  Abdominal:     General: Bowel sounds are normal.     Palpations: Abdomen is soft. There is no mass.     Tenderness: There is no abdominal tenderness. There is no guarding.  Musculoskeletal: Normal range of motion.  Lymphadenopathy:     Cervical: No cervical adenopathy.  Skin:    General: Skin is warm and dry.  Neurological:     Mental Status: She is alert.     Deep Tendon Reflexes: Reflexes are normal and symmetric.     Wt Readings from Last 3 Encounters:  02/28/19 163 lb (73.9 kg)  02/09/19 163 lb (73.9 kg)  01/17/19 166 lb (75.3 kg)    BP 130/70   Pulse 72   Ht 5\' 3"  (1.6 m)   Wt 163 lb (73.9 kg)   BMI 28.87 kg/m   Assessment and Plan:  1. Type 2 diabetes mellitus with hyperglycemia, with long-term current use of insulin (HCC) Chronic.  Controlled.  Patient currently on 15 units of Lantus nightly.  Blood sugars there is occasional high sugars are usually associated with a discrete snack that is obtained prior to testing.  For the most part patient is averaging in the 1 40-1 50 range.  We will check an A1c and renal function panel to assess control will recheck in 4 months. - Renal Function Panel - Hemoglobin A1c

## 2019-03-01 LAB — RENAL FUNCTION PANEL
Albumin: 4.3 g/dL (ref 3.7–4.7)
BUN/Creatinine Ratio: 11 — ABNORMAL LOW (ref 12–28)
BUN: 13 mg/dL (ref 8–27)
CO2: 24 mmol/L (ref 20–29)
Calcium: 9.2 mg/dL (ref 8.7–10.3)
Chloride: 98 mmol/L (ref 96–106)
Creatinine, Ser: 1.15 mg/dL — ABNORMAL HIGH (ref 0.57–1.00)
GFR calc Af Amer: 53 mL/min/{1.73_m2} — ABNORMAL LOW (ref >59)
GFR calc non Af Amer: 46 mL/min/{1.73_m2} — ABNORMAL LOW (ref >59)
Glucose: 179 mg/dL — ABNORMAL HIGH (ref 65–99)
Phosphorus: 4.3 mg/dL (ref 3.0–4.3)
Potassium: 4.9 mmol/L (ref 3.5–5.2)
Sodium: 138 mmol/L (ref 134–144)

## 2019-03-01 LAB — HEMOGLOBIN A1C
Est. average glucose Bld gHb Est-mCnc: 177 mg/dL
Hgb A1c MFr Bld: 7.8 % — ABNORMAL HIGH (ref 4.8–5.6)

## 2019-04-26 ENCOUNTER — Other Ambulatory Visit: Payer: Self-pay

## 2019-04-26 ENCOUNTER — Ambulatory Visit (INDEPENDENT_AMBULATORY_CARE_PROVIDER_SITE_OTHER): Payer: Medicare Other | Admitting: Family Medicine

## 2019-04-26 ENCOUNTER — Encounter: Payer: Self-pay | Admitting: Family Medicine

## 2019-04-26 VITALS — BP 120/92 | HR 72 | Temp 98.0°F | Ht 63.0 in | Wt 165.0 lb

## 2019-04-26 DIAGNOSIS — N342 Other urethritis: Secondary | ICD-10-CM | POA: Diagnosis not present

## 2019-04-26 MED ORDER — CEPHALEXIN 500 MG PO CAPS: 500.0000 mg | ORAL_CAPSULE | Freq: Four times a day (QID) | ORAL | 0 refills | Status: DC

## 2019-04-26 NOTE — Progress Notes (Signed)
Date:  04/26/2019   Name:  Jenna Odom   DOB:  [birth year]   MRN:  [id number]   Chief Complaint: Urinary Tract Infection (started with pressure when urinating last night)  Urinary Tract Infection  This is a new problem. The current episode started yesterday. The problem occurs every urination. The problem has been gradually worsening. The quality of the pain is described as burning (at end of urination). The pain is at a severity of 2/10. The pain is mild. There has been no fever. She is not sexually active. Associated symptoms include frequency and urgency. Pertinent negatives include no chills, discharge, flank pain, hematuria, hesitancy, nausea, sweats or vomiting.    Review of Systems  Constitutional: Negative.  Negative for chills, fatigue, fever and unexpected weight change.  HENT: Negative for congestion, ear discharge, ear pain, rhinorrhea, sinus pressure, sneezing and sore throat.   Eyes: Negative for photophobia, pain, discharge, redness and itching.  Respiratory: Negative for cough, shortness of breath, wheezing and stridor.   Gastrointestinal: Negative for abdominal pain, blood in stool, constipation, diarrhea, nausea and vomiting.  Endocrine: Negative for cold intolerance, heat intolerance, polydipsia, polyphagia and polyuria.  Genitourinary: Positive for frequency and urgency. Negative for dysuria, flank pain, hematuria, hesitancy, menstrual problem, pelvic pain, vaginal bleeding and vaginal discharge.  Musculoskeletal: Negative for arthralgias, back pain and myalgias.  Skin: Negative for rash.  Allergic/Immunologic: Negative for environmental allergies and food allergies.  Neurological: Negative for dizziness, weakness, light-headedness, numbness and headaches.  Hematological: Negative for adenopathy. Does not bruise/bleed easily.  Psychiatric/Behavioral: Negative for dysphoric mood. The patient is not nervous/anxious.     Patient Active Problem List   Diagnosis Date  Noted  . Type 2 diabetes mellitus with hyperglycemia, with long-term current use of insulin ([place]) 02/28/2019  . Mild cognitive impairment 04/06/2018    Allergies  Allergen Reactions  . Ciprofloxacin Nausea And Vomiting    Past Surgical History:  Procedure Laterality Date  . ABDOMINAL HYSTERECTOMY      Social History   Tobacco Use  . Smoking status: Never Smoker  . Smokeless tobacco: Never Used  Substance Use Topics  . Alcohol use: Never    Frequency: Never  . Drug use: Never     Medication list has been reviewed and updated.  Current Meds  Medication Sig  . apixaban (ELIQUIS) 5 MG TABS tablet Take 1 tablet (5 mg total) by mouth 2 (two) times daily. cardio  . busPIRone (BUSPAR) 5 MG tablet Take 3 tablets (15 mg total) by mouth 2 (two) times daily. 1/2 pill in morning and 1 at night  . LANTUS SOLOSTAR 100 UNIT/ML Solostar Pen Inject 15 Units into the skin at bedtime.  . metFORMIN (GLUCOPHAGE) 1000 MG tablet Take 1 tablet (1,000 mg total) by mouth 2 (two) times daily with a meal.  . metoprolol tartrate (LOPRESSOR) 25 MG tablet Take 1 tablet (25 mg total) by mouth 2 (two) times daily.    PHQ 2/9 Scores 01/17/2019  PHQ - 2 Score 1  PHQ- 9 Score 1    BP Readings from Last 3 Encounters:  04/26/19 (!) 120/92  02/28/19 130/70  02/09/19 120/80    Physical Exam Vitals signs and nursing note reviewed.  Constitutional:      General: She is not in acute distress.    Appearance: She is not diaphoretic.  HENT:     Head: Normocephalic and atraumatic.     Right Ear: External ear normal.     Left Ear: External  ear normal.     Nose: Nose normal.  Eyes:     General:        Right eye: No discharge.        Left eye: No discharge.     Conjunctiva/sclera: Conjunctivae normal.     Pupils: Pupils are equal, round, and reactive to light.  Neck:     Musculoskeletal: Normal range of motion and neck supple.     Thyroid: No thyromegaly.     Vascular: No JVD.  Cardiovascular:      Rate and Rhythm: Normal rate and regular rhythm.     Pulses:          Dorsalis pedis pulses are 2+ on the right side and 2+ on the left side.       Posterior tibial pulses are 2+ on the right side and 2+ on the left side.     Heart sounds: Normal heart sounds. No murmur. No friction rub. No gallop.   Pulmonary:     Effort: Pulmonary effort is normal.     Breath sounds: Normal breath sounds.  Abdominal:     General: Bowel sounds are normal.     Palpations: Abdomen is soft. There is no mass.     Tenderness: There is no abdominal tenderness. There is no guarding.  Musculoskeletal: Normal range of motion.     Right foot: Bunion present.     Left foot: Bunion present.  Feet:     Right foot:     Protective Sensation: 10 sites tested. 10 sites sensed.     Skin integrity: Skin integrity normal.     Toenail Condition: Right toenails are normal.     Left foot:     Protective Sensation: 10 sites tested. 10 sites sensed.     Skin integrity: Skin integrity normal.     Toenail Condition: Left toenails are normal.  Lymphadenopathy:     Cervical: No cervical adenopathy.  Skin:    General: Skin is warm and dry.  Neurological:     Mental Status: She is alert.     Deep Tendon Reflexes: Reflexes are normal and symmetric.     Wt Readings from Last 3 Encounters:  04/26/19 165 lb (74.8 kg)  02/28/19 163 lb (73.9 kg)  02/09/19 163 lb (73.9 kg)    BP (!) 120/92   Pulse 72   Temp 98 F (36.7 C) (Oral)   Ht 5\' 3"  (1.6 m)   Wt 165 lb (74.8 kg)   BMI 29.23 kg/m    Assessment and Plan: 1. Urethritis New onset.  Exam and history consistent with urethritis.  Unable to check urine secondary to contamination.  Will initiate cephalosporin 500 mg twice a day for 3 days.

## 2019-05-05 DIAGNOSIS — I4819 Other persistent atrial fibrillation: Secondary | ICD-10-CM | POA: Diagnosis not present

## 2019-05-05 DIAGNOSIS — E118 Type 2 diabetes mellitus with unspecified complications: Secondary | ICD-10-CM | POA: Diagnosis not present

## 2019-05-05 DIAGNOSIS — N183 Chronic kidney disease, stage 3 (moderate): Secondary | ICD-10-CM | POA: Diagnosis not present

## 2019-05-05 DIAGNOSIS — Z794 Long term (current) use of insulin: Secondary | ICD-10-CM | POA: Diagnosis not present

## 2019-05-05 DIAGNOSIS — I10 Essential (primary) hypertension: Secondary | ICD-10-CM | POA: Diagnosis not present

## 2019-06-30 ENCOUNTER — Other Ambulatory Visit: Payer: Self-pay

## 2019-06-30 ENCOUNTER — Encounter: Payer: Self-pay | Admitting: Family Medicine

## 2019-06-30 ENCOUNTER — Ambulatory Visit (INDEPENDENT_AMBULATORY_CARE_PROVIDER_SITE_OTHER): Payer: Medicare HMO | Admitting: Family Medicine

## 2019-06-30 VITALS — BP 118/78 | HR 63 | Resp 16 | Ht 63.0 in | Wt 165.0 lb

## 2019-06-30 DIAGNOSIS — Z794 Long term (current) use of insulin: Secondary | ICD-10-CM | POA: Diagnosis not present

## 2019-06-30 DIAGNOSIS — F419 Anxiety disorder, unspecified: Secondary | ICD-10-CM

## 2019-06-30 DIAGNOSIS — F0280 Dementia in other diseases classified elsewhere without behavioral disturbance: Secondary | ICD-10-CM

## 2019-06-30 DIAGNOSIS — I4811 Longstanding persistent atrial fibrillation: Secondary | ICD-10-CM | POA: Diagnosis not present

## 2019-06-30 DIAGNOSIS — G301 Alzheimer's disease with late onset: Secondary | ICD-10-CM | POA: Diagnosis not present

## 2019-06-30 DIAGNOSIS — E119 Type 2 diabetes mellitus without complications: Secondary | ICD-10-CM

## 2019-06-30 MED ORDER — METFORMIN HCL 1000 MG PO TABS: 1000.0000 mg | ORAL_TABLET | Freq: Two times a day (BID) | ORAL | 1 refills | Status: DC

## 2019-06-30 MED ORDER — LANTUS SOLOSTAR 100 UNIT/ML ~~LOC~~ SOPN: 15.0000 [IU] | PEN_INJECTOR | Freq: Every day | SUBCUTANEOUS | 5 refills | Status: DC

## 2019-06-30 MED ORDER — BUSPIRONE HCL 5 MG PO TABS: 15.0000 mg | ORAL_TABLET | Freq: Two times a day (BID) | ORAL | 5 refills | Status: DC

## 2019-06-30 MED ORDER — METOPROLOL TARTRATE 25 MG PO TABS: 25.0000 mg | ORAL_TABLET | Freq: Two times a day (BID) | ORAL | 1 refills | Status: DC

## 2019-06-30 MED ORDER — BUSPIRONE HCL 5 MG PO TABS: ORAL_TABLET | ORAL | 1 refills | Status: DC

## 2019-06-30 MED ORDER — APIXABAN 5 MG PO TABS: 5.0000 mg | ORAL_TABLET | Freq: Two times a day (BID) | ORAL | 5 refills | Status: DC

## 2019-06-30 NOTE — Progress Notes (Signed)
Date:  06/30/2019   Name:  Jenna Odom   DOB:  [birth year]   MRN:  [id number]   Chief Complaint: No chief complaint on file.  Diabetes She presents for her follow-up diabetic visit. She has type 2 diabetes mellitus. Her disease course has been stable. Hypoglycemia symptoms include confusion. Pertinent negatives for hypoglycemia include no dizziness, headaches or nervousness/anxiousness. There are no diabetic associated symptoms. Pertinent negatives for diabetes include no chest pain, no fatigue, no polydipsia, no polyphagia, no polyuria and no weakness. There are no hypoglycemic complications. Symptoms are stable. There are no diabetic complications. Current diabetic treatment includes insulin injections and diet. She is compliant with treatment all of the time. Her weight is stable. She is following a generally healthy diet. Meal planning includes avoidance of concentrated sweets and carbohydrate counting. She participates in exercise intermittently. There is no change in her home blood glucose trend. Her breakfast blood glucose is taken between 8-9 am. Her breakfast blood glucose range is generally 130-140 mg/dl. An ACE inhibitor/angiotensin II receptor blocker is not being taken. Eye exam is not current.  Heart Problem This is a chronic (atrial fibrillation) problem. The current episode started more than 1 year ago. The problem occurs intermittently. The problem has been gradually improving. Pertinent negatives include no abdominal pain, arthralgias, chest pain, chills, congestion, coughing, fatigue, fever, headaches, myalgias, nausea, numbness, rash, sore throat, vomiting or weakness. Nothing aggravates the symptoms. Treatments tried: metoprolol/eliquist. The treatment provided moderate relief.  Anxiety Presents for follow-up visit. Symptoms include confusion and decreased concentration. Patient reports no chest pain, depressed mood, dizziness, excessive worry, feeling of choking, insomnia,  irritability, nausea, nervous/anxious behavior, palpitations, panic, restlessness or shortness of breath. Symptoms occur occasionally. The severity of symptoms is mild.      Review of Systems  Constitutional: Negative.  Negative for chills, fatigue, fever, irritability and unexpected weight change.  HENT: Negative for congestion, ear discharge, ear pain, rhinorrhea, sinus pressure, sneezing and sore throat.   Eyes: Negative for photophobia, pain, discharge, redness and itching.  Respiratory: Negative for cough, shortness of breath, wheezing and stridor.   Cardiovascular: Negative for chest pain and palpitations.  Gastrointestinal: Negative for abdominal pain, blood in stool, constipation, diarrhea, nausea and vomiting.  Endocrine: Negative for cold intolerance, heat intolerance, polydipsia, polyphagia and polyuria.  Genitourinary: Negative for dysuria, flank pain, frequency, hematuria, menstrual problem, pelvic pain, urgency, vaginal bleeding and vaginal discharge.  Musculoskeletal: Negative for arthralgias, back pain and myalgias.  Skin: Negative for rash.  Allergic/Immunologic: Negative for environmental allergies and food allergies.  Neurological: Negative for dizziness, weakness, light-headedness, numbness and headaches.  Hematological: Negative for adenopathy. Does not bruise/bleed easily.  Psychiatric/Behavioral: Positive for confusion and decreased concentration. Negative for dysphoric mood. The patient is not nervous/anxious and does not have insomnia.     Patient Active Problem List   Diagnosis Date Noted  . Type 2 diabetes mellitus with hyperglycemia, with long-term current use of insulin ([place]) 02/28/2019  . Mild cognitive impairment 04/06/2018    Allergies  Allergen Reactions  . Ciprofloxacin Nausea And Vomiting    Past Surgical History:  Procedure Laterality Date  . ABDOMINAL HYSTERECTOMY      Social History   Tobacco Use  . Smoking status: Never Smoker  .  Smokeless tobacco: Never Used  Substance Use Topics  . Alcohol use: Never    Frequency: Never  . Drug use: Never     Medication list has been reviewed and updated.  Current Meds  Medication  Sig  . apixaban (ELIQUIS) 5 MG TABS tablet Take 1 tablet (5 mg total) by mouth 2 (two) times daily. cardio  . busPIRone (BUSPAR) 5 MG tablet Take 3 tablets (15 mg total) by mouth 2 (two) times daily. 1/2 pill in morning and 1 at night  . LANTUS SOLOSTAR 100 UNIT/ML Solostar Pen Inject 15 Units into the skin at bedtime.  . metFORMIN (GLUCOPHAGE) 1000 MG tablet Take 1 tablet (1,000 mg total) by mouth 2 (two) times daily with a meal.  . metoprolol tartrate (LOPRESSOR) 25 MG tablet Take 1 tablet (25 mg total) by mouth 2 (two) times daily.    PHQ 2/9 Scores 06/30/2019 01/17/2019  PHQ - 2 Score 0 1  PHQ- 9 Score - 1    BP Readings from Last 3 Encounters:  06/30/19 118/78  04/26/19 (!) 120/92  02/28/19 130/70    Physical Exam Vitals signs and nursing note reviewed.  Constitutional:      General: She is not in acute distress.    Appearance: She is not diaphoretic.  HENT:     Head: Normocephalic and atraumatic.     Right Ear: Tympanic membrane, ear canal and external ear normal.     Left Ear: Tympanic membrane, ear canal and external ear normal.     Nose: Nose normal. No congestion or rhinorrhea.     Mouth/Throat:     Mouth: Mucous membranes are moist.  Eyes:     General:        Right eye: No discharge.        Left eye: No discharge.     Conjunctiva/sclera: Conjunctivae normal.     Pupils: Pupils are equal, round, and reactive to light.  Neck:     Musculoskeletal: Normal range of motion and neck supple.     Thyroid: No thyromegaly.     Vascular: No JVD.  Cardiovascular:     Rate and Rhythm: Normal rate and regular rhythm.     Chest Wall: PMI is not displaced.     Pulses: Normal pulses.          Carotid pulses are 2+ on the right side and 2+ on the left side.      Radial pulses are 2+  on the right side and 2+ on the left side.       Femoral pulses are 2+ on the right side and 2+ on the left side.      Popliteal pulses are 2+ on the right side and 2+ on the left side.       Dorsalis pedis pulses are 2+ on the right side and 2+ on the left side.       Posterior tibial pulses are 2+ on the right side and 2+ on the left side.     Heart sounds: Normal heart sounds, S1 normal and S2 normal. No murmur. No systolic murmur. No diastolic murmur. No friction rub. No gallop. No S3 or S4 sounds.   Pulmonary:     Effort: Pulmonary effort is normal.     Breath sounds: Normal breath sounds. No decreased breath sounds, wheezing, rhonchi or rales.  Abdominal:     General: Bowel sounds are normal.     Palpations: Abdomen is soft. There is no mass.     Tenderness: There is no abdominal tenderness. There is no guarding.  Musculoskeletal: Normal range of motion.     Right lower leg: No edema.     Left lower leg: No edema.  Lymphadenopathy:  Cervical: No cervical adenopathy.  Skin:    General: Skin is warm and dry.  Neurological:     Mental Status: She is alert.     Deep Tendon Reflexes: Reflexes are normal and symmetric.     Wt Readings from Last 3 Encounters:  06/30/19 165 lb (74.8 kg)  04/26/19 165 lb (74.8 kg)  02/28/19 163 lb (73.9 kg)    BP 118/78   Pulse 63   Resp 16   Ht 5\' 3"  (1.6 m)   Wt 165 lb (74.8 kg)   SpO2 99%   BMI 29.23 kg/m   Assessment and Plan:  1. Late onset Alzheimer's disease without behavioral disturbance [facility]) Patient has late onset Alzheimer's with some wandering possibilities.  Will continue buspirone 1/2 tablet in the morning and 1 at night. - busPIRone (BUSPAR) 5 MG tablet; Take 1/2 pill in morning and 1 at night  Dispense: 60 tablet; Refill: 5  2. Longstanding persistent atrial fibrillation ([facility]) Patient has long standing persistent atrial fibrillation patient patient will continue metoprolol tartrate 25 mg 1 twice a day for rate control  and Eliquis 5 mg 1 twice a day for anticoagulation prophylaxis. - metoprolol tartrate (LOPRESSOR) 25 MG tablet; Take 1 tablet (25 mg total) by mouth 2 (two) times daily.  Dispense: 180 tablet; Refill: 1 - apixaban (ELIQUIS) 5 MG TABS tablet; Take 1 tablet (5 mg total) by mouth 2 (two) times daily. cardio  Dispense: 60 tablet; Refill: 5  3. Type 2 diabetes mellitus without complication, with long-term current use of insulin (HCC) Chronic.  Controlled.  Will continue metformin 1 g twice a day and Lantus 15 units every hour of sleep.  Patient will continue hemoglobin A1c and renal function panel for evaluation of diabetic control - metFORMIN (GLUCOPHAGE) 1000 MG tablet; Take 1 tablet (1,000 mg total) by mouth 2 (two) times daily with a meal.  Dispense: 180 tablet; Refill: 1 - LANTUS SOLOSTAR 100 UNIT/ML Solostar Pen; Inject 15 Units into the skin at bedtime.  Dispense: 15 mL; Refill: 5 - Hemoglobin A1c - Renal function panel  4. Anxiety Patient has an anxiety component with her Alzheimer's.  This is controlled on her buspirone 1/2 tablet in the morning and 1 at night - busPIRone (BUSPAR) 5 MG tablet;  1/2 pill in morning and 1 at night  Dispense: 60 tablet; Refill: 5

## 2019-07-01 LAB — RENAL FUNCTION PANEL
Albumin: 3.9 g/dL (ref 3.7–4.7)
BUN/Creatinine Ratio: 12 (ref 12–28)
BUN: 15 mg/dL (ref 8–27)
CO2: 24 mmol/L (ref 20–29)
Calcium: 9.4 mg/dL (ref 8.7–10.3)
Chloride: 102 mmol/L (ref 96–106)
Creatinine, Ser: 1.22 mg/dL — ABNORMAL HIGH (ref 0.57–1.00)
GFR calc Af Amer: 49 mL/min/{1.73_m2} — ABNORMAL LOW (ref >59)
GFR calc non Af Amer: 43 mL/min/{1.73_m2} — ABNORMAL LOW (ref >59)
Glucose: 289 mg/dL — ABNORMAL HIGH (ref 65–99)
Phosphorus: 3.8 mg/dL (ref 3.0–4.3)
Potassium: 5 mmol/L (ref 3.5–5.2)
Sodium: 139 mmol/L (ref 134–144)

## 2019-07-01 LAB — HEMOGLOBIN A1C
Est. average glucose Bld gHb Est-mCnc: 192 mg/dL
Hgb A1c MFr Bld: 8.3 % — ABNORMAL HIGH (ref 4.8–5.6)

## 2019-07-04 DIAGNOSIS — E119 Type 2 diabetes mellitus without complications: Secondary | ICD-10-CM | POA: Diagnosis not present

## 2019-07-07 ENCOUNTER — Other Ambulatory Visit: Payer: Self-pay

## 2019-07-25 DIAGNOSIS — H26493 Other secondary cataract, bilateral: Secondary | ICD-10-CM | POA: Diagnosis not present

## 2019-07-31 DIAGNOSIS — H26493 Other secondary cataract, bilateral: Secondary | ICD-10-CM | POA: Diagnosis not present

## 2019-10-27 ENCOUNTER — Other Ambulatory Visit: Payer: Self-pay

## 2019-10-27 DIAGNOSIS — E119 Type 2 diabetes mellitus without complications: Secondary | ICD-10-CM

## 2019-10-27 DIAGNOSIS — Z794 Long term (current) use of insulin: Secondary | ICD-10-CM

## 2019-10-27 MED ORDER — BASAGLAR KWIKPEN 100 UNIT/ML ~~LOC~~ SOPN: 15.0000 [IU] | PEN_INJECTOR | Freq: Every day | SUBCUTANEOUS | 0 refills | Status: DC

## 2019-10-31 ENCOUNTER — Ambulatory Visit: Payer: Self-pay | Admitting: Family Medicine

## 2019-11-24 ENCOUNTER — Ambulatory Visit: Payer: Self-pay | Admitting: Family Medicine

## 2020-01-03 ENCOUNTER — Ambulatory Visit (INDEPENDENT_AMBULATORY_CARE_PROVIDER_SITE_OTHER): Payer: Medicare HMO | Admitting: Family Medicine

## 2020-01-03 ENCOUNTER — Other Ambulatory Visit: Payer: Self-pay

## 2020-01-03 ENCOUNTER — Encounter: Payer: Self-pay | Admitting: Family Medicine

## 2020-01-03 VITALS — BP 130/68 | HR 60 | Ht 63.0 in | Wt 159.0 lb

## 2020-01-03 DIAGNOSIS — I4811 Longstanding persistent atrial fibrillation: Secondary | ICD-10-CM

## 2020-01-03 DIAGNOSIS — F419 Anxiety disorder, unspecified: Secondary | ICD-10-CM

## 2020-01-03 DIAGNOSIS — I10 Essential (primary) hypertension: Secondary | ICD-10-CM

## 2020-01-03 DIAGNOSIS — E119 Type 2 diabetes mellitus without complications: Secondary | ICD-10-CM

## 2020-01-03 DIAGNOSIS — F0280 Dementia in other diseases classified elsewhere without behavioral disturbance: Secondary | ICD-10-CM

## 2020-01-03 DIAGNOSIS — Z794 Long term (current) use of insulin: Secondary | ICD-10-CM

## 2020-01-03 DIAGNOSIS — G301 Alzheimer's disease with late onset: Secondary | ICD-10-CM

## 2020-01-03 MED ORDER — BUSPIRONE HCL 5 MG PO TABS: ORAL_TABLET | ORAL | 1 refills | Status: DC

## 2020-01-03 MED ORDER — METFORMIN HCL 1000 MG PO TABS: 1000.0000 mg | ORAL_TABLET | Freq: Two times a day (BID) | ORAL | 1 refills | Status: DC

## 2020-01-03 MED ORDER — METOPROLOL TARTRATE 25 MG PO TABS: 25.0000 mg | ORAL_TABLET | Freq: Two times a day (BID) | ORAL | 1 refills | Status: DC

## 2020-01-03 NOTE — Progress Notes (Signed)
Date:  01/03/2020   Name:  Jenna Odom   DOB:  [birth year]   MRN:  [id number]   Chief Complaint: Hypertension, Diabetes, and Anxiety  Hypertension This is a chronic problem. The current episode started more than 1 year ago. The problem has been gradually improving since onset. The problem is controlled. Associated symptoms include anxiety and blurred vision. Pertinent negatives include no chest pain, headaches, malaise/fatigue, neck pain, orthopnea, palpitations, peripheral edema, PND, shortness of breath or sweats. There are no associated agents to hypertension. There are no known risk factors for coronary artery disease. Past treatments include beta blockers. The current treatment provides moderate improvement. There are no compliance problems.  There is no history of angina, kidney disease, CAD/MI, CVA, heart failure, left ventricular hypertrophy, PVD or retinopathy. There is no history of chronic renal disease, a hypertension causing med or renovascular disease.  Diabetes She presents for her follow-up diabetic visit. She has type 2 diabetes mellitus. Her disease course has been fluctuating. Hypoglycemia symptoms include confusion and nervousness/anxiousness. Pertinent negatives for hypoglycemia include no dizziness, headaches or sweats. Associated symptoms include blurred vision. Pertinent negatives for diabetes include no chest pain, no fatigue, no foot paresthesias, no foot ulcerations, no polydipsia, no polyphagia, no polyuria, no visual change, no weakness and no weight loss. There are no hypoglycemic complications. Diabetic symptom progression: uncertain. There are no diabetic complications. Pertinent negatives for diabetic complications include no CVA, impotence, PVD or retinopathy. There are no known risk factors for coronary artery disease. Current diabetic treatment includes oral agent (monotherapy) and insulin injections. She is compliant with treatment some of the time. Her weight is  stable. Her breakfast blood glucose is taken between 8-9 am. Her breakfast blood glucose range is generally 70-90 mg/dl. An ACE inhibitor/angiotensin II receptor blocker is not being taken. She does not see a podiatrist.Eye exam is not current.  Anxiety Presents for follow-up visit. Symptoms include confusion, decreased concentration, excessive worry and nervous/anxious behavior. Patient reports no chest pain, compulsions, depressed mood, dizziness, dry mouth, feeling of choking, hyperventilation, impotence, insomnia, irritability, malaise, muscle tension, nausea, obsessions, palpitations or shortness of breath.      Lab Results  Component Value Date   CREATININE 1.22 (H) 06/30/2019   BUN 15 06/30/2019   NA 139 06/30/2019   K 5.0 06/30/2019   CL 102 06/30/2019   CO2 24 06/30/2019   No results found for: CHOL, HDL, LDLCALC, LDLDIRECT, TRIG, CHOLHDL No results found for: TSH Lab Results  Component Value Date   HGBA1C 8.3 (H) 06/30/2019   Lab Results  Component Value Date   WBC 7.6 06/03/2018   HGB 11.7 (L) 06/03/2018   HCT 34.8 (L) 06/03/2018   MCV 88.4 06/03/2018   PLT 375 06/03/2018   Lab Results  Component Value Date   ALT 15 05/28/2018   AST 22 05/28/2018   ALKPHOS 58 05/28/2018   BILITOT 1.2 05/28/2018     Review of Systems  Constitutional: Negative.  Negative for chills, fatigue, fever, irritability, malaise/fatigue, unexpected weight change and weight loss.  HENT: Negative for congestion, ear discharge, ear pain, rhinorrhea, sinus pressure, sneezing and sore throat.   Eyes: Positive for blurred vision. Negative for photophobia, pain, discharge, redness and itching.  Respiratory: Negative for cough, shortness of breath, wheezing and stridor.   Cardiovascular: Negative for chest pain, palpitations, orthopnea and PND.  Gastrointestinal: Negative for abdominal pain, blood in stool, constipation, diarrhea, nausea and vomiting.  Endocrine: Negative for cold intolerance,  heat intolerance,  polydipsia, polyphagia and polyuria.  Genitourinary: Negative for dysuria, flank pain, frequency, hematuria, impotence, menstrual problem, pelvic pain, urgency, vaginal bleeding and vaginal discharge.  Musculoskeletal: Negative for arthralgias, back pain, myalgias and neck pain.  Skin: Negative for rash.  Allergic/Immunologic: Negative for environmental allergies and food allergies.  Neurological: Negative for dizziness, weakness, light-headedness, numbness and headaches.  Hematological: Negative for adenopathy. Does not bruise/bleed easily.  Psychiatric/Behavioral: Positive for confusion and decreased concentration. Negative for dysphoric mood. The patient is nervous/anxious. The patient does not have insomnia.     Patient Active Problem List   Diagnosis Date Noted  . Type 2 diabetes mellitus with hyperglycemia, with long-term current use of insulin ([place]) 02/28/2019  . Mild cognitive impairment 04/06/2018    Allergies  Allergen Reactions  . Ciprofloxacin Nausea And Vomiting    Past Surgical History:  Procedure Laterality Date  . ABDOMINAL HYSTERECTOMY      Social History   Tobacco Use  . Smoking status: Never Smoker  . Smokeless tobacco: Never Used  Substance Use Topics  . Alcohol use: Never  . Drug use: Never     Medication list has been reviewed and updated.  Current Meds  Medication Sig  . apixaban (ELIQUIS) 5 MG TABS tablet Take 1 tablet (5 mg total) by mouth 2 (two) times daily. cardio  . busPIRone (BUSPAR) 5 MG tablet One half tablet in the morning and 1 tablet at night.  . Insulin Glargine (BASAGLAR KWIKPEN) 100 UNIT/ML SOPN Inject 0.15 mLs (15 Units total) into the skin daily. (Patient taking differently: Inject 12 Units into the skin daily. )  . melatonin 3 MG TABS tablet Take 3 mg by mouth at bedtime.  . metFORMIN (GLUCOPHAGE) 1000 MG tablet Take 1 tablet (1,000 mg total) by mouth 2 (two) times daily with a meal.  . metoprolol tartrate  (LOPRESSOR) 25 MG tablet Take 1 tablet (25 mg total) by mouth 2 (two) times daily.    PHQ 2/9 Scores 06/30/2019 01/17/2019  PHQ - 2 Score 0 1  PHQ- 9 Score - 1    BP Readings from Last 3 Encounters:  01/03/20 130/68  06/30/19 118/78  04/26/19 (!) 120/92    Physical Exam Vitals and nursing note reviewed.  Constitutional:      General: She is not in acute distress.    Appearance: She is not diaphoretic.  HENT:     Head: Normocephalic and atraumatic.     Right Ear: External ear normal.     Left Ear: External ear normal.     Nose: Nose normal.  Eyes:     General:        Right eye: No discharge.        Left eye: No discharge.     Conjunctiva/sclera: Conjunctivae normal.     Pupils: Pupils are equal, round, and reactive to light.  Neck:     Thyroid: No thyromegaly.     Vascular: No JVD.  Cardiovascular:     Rate and Rhythm: Normal rate and regular rhythm.     Heart sounds: Normal heart sounds. No murmur. No friction rub. No gallop.   Pulmonary:     Effort: Pulmonary effort is normal.     Breath sounds: Normal breath sounds.  Abdominal:     General: Bowel sounds are normal.     Palpations: Abdomen is soft. There is no mass.     Tenderness: There is no abdominal tenderness. There is no guarding.  Musculoskeletal:  General: Normal range of motion.     Cervical back: Normal range of motion and neck supple.  Lymphadenopathy:     Cervical: No cervical adenopathy.  Skin:    General: Skin is warm and dry.  Neurological:     Mental Status: She is alert.     Deep Tendon Reflexes: Reflexes are normal and symmetric.     Wt Readings from Last 3 Encounters:  01/03/20 159 lb (72.1 kg)  06/30/19 165 lb (74.8 kg)  04/26/19 165 lb (74.8 kg)    BP 130/68   Pulse 60   Ht 5\' 3"  (1.6 m)   Wt 159 lb (72.1 kg)   BMI 28.17 kg/m   Assessment and Plan:  1. Type 2 diabetes mellitus without complication, with long-term current use of insulin (HCC) Chronic.  Questionable  control.  Caretaker says that blood sugars are in the 70-90 range.  However question this because she has never had normal readings.  Caretaker has started an over-the-counter medication called glucose burn which I question the validity however she says it is dropped her blood sugars down to the point that she is decreased her insulin.  This has not been done with any instruction from physicians we will check her A1c to see if this is indeed the case.  In the meantime patient will continue current dosing of Basaglar 12 units once a day.  Patient also takes Metformin 1 g twice a day.  Will check A1c to assess control as well as microalbuminuria and CMP for GFR assessment. - Microalbumin, urine - HgB A1c - Comprehensive Metabolic Panel (CMET) - Lipid Panel With LDL/HDL Ratio - metFORMIN (GLUCOPHAGE) 1000 MG tablet; Take 1 tablet (1,000 mg total) by mouth 2 (two) times daily with a meal.  Dispense: 180 tablet; Refill: 1  2. Anxiety Chronic.  Controlled.  Stable.  Continue buspirone 5 mg 1/2 tablet in the morning and 1 tablet at night. - busPIRone (BUSPAR) 5 MG tablet; One half tablet in the morning and 1 tablet at night.  Dispense: 135 tablet; Refill: 1  3. Essential hypertension Chronic.  Controlled.  Stable.  Patient is currently limiting sodium intake.  Patient is on metoprolol 25 mg but I think this is for rate control for atrial fibrillation.  If the microalbuminuria is positive we will be adding lisinopril. - Comprehensive Metabolic Panel (CMET)  4. Late onset Alzheimer's disease without behavioral disturbance (HCC) Chronic.  Controlled.  Stable see above.  Gad score 0.  Continue present dosing of buspirone.  Patient is currently not on any dementia medications but this will be considered in the future. - busPIRone (BUSPAR) 5 MG tablet; One half tablet in the morning and 1 tablet at night.  Dispense: 135 tablet; Refill: 1  5. Longstanding persistent atrial fibrillation (HCC) Chronic.   Controlled.  Stable.  Followed by Dr. .  Currently patient is on Eliquis 5 mg daily along with metoprolol 25 mg once a day. - metoprolol tartrate (LOPRESSOR) 25 MG tablet; Take 1 tablet (25 mg total) by mouth 2 (two) times daily.  Dispense: 180 tablet; Refill: 1

## 2020-01-04 ENCOUNTER — Other Ambulatory Visit: Payer: Self-pay

## 2020-01-04 ENCOUNTER — Telehealth: Payer: Self-pay | Admitting: Family Medicine

## 2020-01-04 DIAGNOSIS — R7989 Other specified abnormal findings of blood chemistry: Secondary | ICD-10-CM

## 2020-01-04 LAB — LIPID PANEL WITH LDL/HDL RATIO
Cholesterol, Total: 159 mg/dL (ref 100–199)
HDL: 40 mg/dL (ref >39)
LDL Chol Calc (NIH): 102 mg/dL — ABNORMAL HIGH (ref 0–99)
LDL/HDL Ratio: 2.6 ratio (ref 0.0–3.2)
Triglycerides: 92 mg/dL (ref 0–149)
VLDL Cholesterol Cal: 17 mg/dL (ref 5–40)

## 2020-01-04 LAB — COMPREHENSIVE METABOLIC PANEL
ALT: 8 IU/L (ref 0–32)
AST: 15 IU/L (ref 0–40)
Albumin/Globulin Ratio: 2 (ref 1.2–2.2)
Albumin: 4.3 g/dL (ref 3.7–4.7)
Alkaline Phosphatase: 69 IU/L (ref 39–117)
BUN/Creatinine Ratio: 13 (ref 12–28)
BUN: 16 mg/dL (ref 8–27)
Bilirubin Total: 0.8 mg/dL (ref 0.0–1.2)
CO2: 23 mmol/L (ref 20–29)
Calcium: 9.5 mg/dL (ref 8.7–10.3)
Chloride: 103 mmol/L (ref 96–106)
Creatinine, Ser: 1.26 mg/dL — ABNORMAL HIGH (ref 0.57–1.00)
GFR calc Af Amer: 47 mL/min/{1.73_m2} — ABNORMAL LOW (ref >59)
GFR calc non Af Amer: 41 mL/min/{1.73_m2} — ABNORMAL LOW (ref >59)
Globulin, Total: 2.2 g/dL (ref 1.5–4.5)
Glucose: 109 mg/dL — ABNORMAL HIGH (ref 65–99)
Potassium: 5.3 mmol/L — ABNORMAL HIGH (ref 3.5–5.2)
Sodium: 138 mmol/L (ref 134–144)
Total Protein: 6.5 g/dL (ref 6.0–8.5)

## 2020-01-04 LAB — HEMOGLOBIN A1C
Est. average glucose Bld gHb Est-mCnc: 160 mg/dL
Hgb A1c MFr Bld: 7.2 % — ABNORMAL HIGH (ref 4.8–5.6)

## 2020-01-04 LAB — MICROALBUMIN, URINE: Microalbumin, Urine: 42.2 ug/mL

## 2020-01-04 MED ORDER — LISINOPRIL 2.5 MG PO TABS: 2.5000 mg | ORAL_TABLET | Freq: Every day | ORAL | 3 refills | Status: DC

## 2020-01-04 NOTE — Telephone Encounter (Signed)
Copied from CRM [id number]. Topic: General - Call Back - No Documentation >> Jan 04, 2020  3:14 PM Randol Kern wrote: Reason for CRM: Pt returned Tara's call, tried calling office back. Please advise  Best contact: [phone]

## 2020-01-04 NOTE — Progress Notes (Signed)
Put In referral to nephrology and sent in Lisinopril 2.5mg 

## 2020-01-08 ENCOUNTER — Telehealth: Payer: Self-pay | Admitting: Family Medicine

## 2020-01-08 NOTE — Telephone Encounter (Signed)
Patient returning call to Delice Bison, requesting call back.

## 2020-01-08 NOTE — Telephone Encounter (Signed)
Had to leave a message for pt to call us back- unsure of what they are needing

## 2020-01-09 ENCOUNTER — Telehealth: Payer: Self-pay | Admitting: Family Medicine

## 2020-01-09 NOTE — Telephone Encounter (Signed)
Spoke to daughter, Inetta Fermo, and told her that pt needs to take the Lisinopril 2.5mg  for kidney protection. Inetta Fermo voiced understanding

## 2020-01-09 NOTE — Telephone Encounter (Signed)
Copied from CRM [id number]. Topic: General - Other >> Jan 09, 2020  2:05 PM Elliot Gault wrote: Patient daughter Inetta Fermo would like a follow up call regarding most recent medication change best # [phone]

## 2020-01-16 ENCOUNTER — Other Ambulatory Visit: Payer: Self-pay | Admitting: Family Medicine

## 2020-01-16 DIAGNOSIS — Z794 Long term (current) use of insulin: Secondary | ICD-10-CM

## 2020-01-16 NOTE — Telephone Encounter (Signed)
Requested Prescriptions  Pending Prescriptions Disp Refills  . Insulin Glargine (BASAGLAR KWIKPEN) 100 UNIT/ML [Pharmacy Med Name: Basaglar KwikPen 100 UNIT/ML Subcutaneous Solution Pen-injector] 15 mL 0    Sig: INJECT 15 UNITS SUBCUTANEOUSLY ONCE DAILY     Endocrinology:  Diabetes - Insulins Passed - 01/16/2020  9:52 AM      Passed - HBA1C is between 0 and 7.9 and within 180 days    Hgb A1c MFr Bld  Date Value Ref Range Status  01/03/2020 7.2 (H) 4.8 - 5.6 % Final    Comment:             Prediabetes: 5.7 - 6.4          Diabetes: >6.4          Glycemic control for adults with diabetes: <7.0          Passed - Valid encounter within last 6 months    Recent Outpatient Visits          1 week ago Type 2 diabetes mellitus without complication, with long-term current use of insulin (HCC)   [facility] Duanne Limerick, MD   6 months ago Type 2 diabetes mellitus without complication, with long-term current use of insulin (HCC)   [facility] Duanne Limerick, MD   8 months ago Urethritis   [facility] Duanne Limerick, MD   10 months ago Type 2 diabetes mellitus with hyperglycemia, with long-term current use of insulin (HCC)   [facility] Duanne Limerick, MD   11 months ago Acute midline low back pain without sciatica   [facility] Duanne Limerick, MD      Future Appointments            In 3 months Duanne Limerick, MD [facility], [facility]

## 2020-01-21 ENCOUNTER — Other Ambulatory Visit: Payer: Self-pay | Admitting: Family Medicine

## 2020-01-21 DIAGNOSIS — I4811 Longstanding persistent atrial fibrillation: Secondary | ICD-10-CM

## 2020-01-21 NOTE — Telephone Encounter (Signed)
Requested medication (s) are due for refill today: yes  Requested medication (s) are on the active medication list: yes  Last refill:  06/30/19  Future visit scheduled: yes  Notes to clinic:  overdue for lab work   Requested Prescriptions  Pending Prescriptions Disp Refills   ELIQUIS 5 MG TABS tablet [Pharmacy Med Name: Eliquis 5 MG Oral Tablet] 60 tablet 0    Sig: Take 1 tablet by mouth twice daily      Hematology:  Anticoagulants Failed - 01/21/2020 10:31 AM      Failed - HGB in normal range and within 360 days    Hemoglobin  Date Value Ref Range Status  06/03/2018 11.7 (L) 12.0 - 16.0 g/dL Final          Failed - PLT in normal range and within 360 days    Platelets  Date Value Ref Range Status  06/03/2018 375 150 - 440 K/uL Final          Failed - HCT in normal range and within 360 days    HCT  Date Value Ref Range Status  06/03/2018 34.8 (L) 35.0 - 47.0 % Final          Failed - Cr in normal range and within 360 days    Creatinine, Ser  Date Value Ref Range Status  01/03/2020 1.26 (H) 0.57 - 1.00 mg/dL Final          Passed - Valid encounter within last 12 months    Recent Outpatient Visits           2 weeks ago Type 2 diabetes mellitus without complication, with long-term current use of insulin (HCC)   [facility] Duanne Limerick, MD   6 months ago Type 2 diabetes mellitus without complication, with long-term current use of insulin (HCC)   [facility] Duanne Limerick, MD   9 months ago Urethritis   [facility] Duanne Limerick, MD   10 months ago Type 2 diabetes mellitus with hyperglycemia, with long-term current use of insulin (HCC)   [facility] Duanne Limerick, MD   11 months ago Acute midline low back pain without sciatica   [facility] Duanne Limerick, MD       Future Appointments             In 3 months Duanne Limerick, MD [facility], [facility]

## 2020-01-23 ENCOUNTER — Other Ambulatory Visit: Payer: Self-pay | Admitting: Family Medicine

## 2020-01-23 DIAGNOSIS — I4811 Longstanding persistent atrial fibrillation: Secondary | ICD-10-CM

## 2020-01-23 NOTE — Telephone Encounter (Signed)
LVM to Informed patient to have it refilled to her Cardiologist.

## 2020-02-18 DIAGNOSIS — I10 Essential (primary) hypertension: Secondary | ICD-10-CM | POA: Insufficient documentation

## 2020-02-19 ENCOUNTER — Other Ambulatory Visit: Payer: Self-pay | Admitting: Nephrology

## 2020-02-19 DIAGNOSIS — N1832 Chronic kidney disease, stage 3b: Secondary | ICD-10-CM

## 2020-03-01 ENCOUNTER — Other Ambulatory Visit: Payer: Self-pay

## 2020-03-01 ENCOUNTER — Ambulatory Visit
Admission: RE | Admit: 2020-03-01 | Discharge: 2020-03-01 | Disposition: A | Discharge status: Home / Self Care | Payer: Medicare HMO | Source: Ambulatory Visit | Attending: Nephrology | Admitting: Nephrology

## 2020-03-01 DIAGNOSIS — N1832 Chronic kidney disease, stage 3b: Secondary | ICD-10-CM | POA: Diagnosis not present

## 2020-03-02 ENCOUNTER — Encounter: Payer: Self-pay | Admitting: Family Medicine

## 2020-04-05 ENCOUNTER — Ambulatory Visit: Payer: 59 | Admitting: Family Medicine

## 2020-04-12 ENCOUNTER — Ambulatory Visit (INDEPENDENT_AMBULATORY_CARE_PROVIDER_SITE_OTHER): Payer: Medicare HMO | Admitting: Family Medicine

## 2020-04-12 ENCOUNTER — Other Ambulatory Visit: Payer: Self-pay

## 2020-04-12 ENCOUNTER — Encounter: Payer: Self-pay | Admitting: Family Medicine

## 2020-04-12 VITALS — BP 104/80 | HR 71 | Ht 63.0 in | Wt 152.0 lb

## 2020-04-12 DIAGNOSIS — M1711 Unilateral primary osteoarthritis, right knee: Secondary | ICD-10-CM | POA: Diagnosis not present

## 2020-04-12 MED ORDER — DICLOFENAC SODIUM 1 % EX GEL: 2.0000 g | Freq: Four times a day (QID) | CUTANEOUS | 1 refills | Status: DC

## 2020-04-12 NOTE — Patient Instructions (Signed)
Chronic Knee Pain, Adult Chronic knee pain is pain in one or both knees that lasts longer than 3 months. Symptoms of chronic knee pain may include swelling, stiffness, and discomfort. Age-related wear and tear (osteoarthritis) of the knee joint is the most common cause of chronic knee pain. Other possible causes include:  A long-term immune-related disease that causes inflammation of the knee (rheumatoid arthritis). This usually affects both knees.  Inflammatory arthritis, such as gout or pseudogout.  An injury to the knee that causes arthritis.  An injury to the knee that damages the ligaments. Ligaments are strong tissues that connect bones to each other.  Runner's knee or pain behind the kneecap. Treatment for chronic knee pain depends on the cause. The main treatments for chronic knee pain are physical therapy and weight loss. This condition may also be treated with medicines, injections, a knee sleeve or brace, and by using crutches. Rest, ice, compression (pressure), and elevation (RICE) therapy may also be recommended. Follow these instructions at home: If you have a knee sleeve or brace:   Wear it as told by your health care provider. Remove it only as told by your health care provider.  Loosen it if your toes tingle, become numb, or turn cold and blue.  Keep it clean.  If the sleeve or brace is not waterproof: ? Do not let it get wet. ? Remove it if allowed by your health care provider, or cover it with a watertight covering when you take a bath or a shower. Managing pain, stiffness, and swelling      If directed, apply heat to the affected area as often as told by your health care provider. Use the heat source that your health care provider recommends, such as a moist heat pack or a heating pad. ? If you have a removable sleeve or brace, remove it as told by your health care provider. ? Place a towel between your skin and the heat source. ? Leave the heat on for 20-30  minutes. ? Remove the heat if your skin turns bright red. This is especially important if you are unable to feel pain, heat, or cold. You may have a greater risk of getting burned.  If directed, put ice on the affected area. ? If you have a removable sleeve or brace, remove it as told by your health care provider. ? Put ice in a plastic bag. ? Place a towel between your skin and the bag. ? Leave the ice on for 20 minutes, 2-3 times a day.  Move your toes often to reduce stiffness and swelling.  Raise (elevate) the injured area above the level of your heart while you are sitting or lying down. Activity  Avoid activities where both feet leave the ground at the same time (high-impact activities). Examples are running, jumping rope, and doing jumping jacks.  Return to your normal activities as told by your health care provider. Ask your health care provider what activities are safe for you.  Follow the exercise plan that your health care provider designed for you. Your health care provider may suggest that you: ? Avoid activities that make knee pain worse. This may require you to change your exercise routines, sport participation, or job duties. ? Wear shoes with cushioned soles. ? Avoid high-impact activities or sports that require running and sudden changes in direction. ? Do physical therapy as told by your health care provider. Physical therapy is planned to match your needs and abilities. It may include  exercises for strength, flexibility, stability, and endurance. ? Do exercises that increase balance and strength, such as tai chi and yoga.  Do not use the injured limb to support your body weight until your health care provider says that you can. Use crutches, a cane, or a walker, as told by your health care provider. General instructions  Take over-the-counter and prescription medicines only as told by your health care provider.  Lose weight if you are overweight. Losing even a little  weight can reduce knee pain. Ask your health care provider what your ideal weight is, and how to safely lose extra weight. A food expert (dietitian) may be able to help you plan your meals.  Do not use any products that contain nicotine or tobacco, such as cigarettes, e-cigarettes, and chewing tobacco. These can delay healing. If you need help quitting, ask your health care provider.  Keep all follow-up visits as told by your health care provider. This is important. Contact a health care provider if:  You have knee pain that is not getting better or gets worse.  You are unable to do your physical therapy exercises due to knee pain. Get help right away if:  Your knee swells and the swelling becomes worse.  You cannot move your knee.  You have severe knee pain. Summary  Knee pain that lasts more than 3 months is considered chronic knee pain.  The main treatments for chronic knee pain are physical therapy and weight loss. You may also need to take medicines, wear a knee sleeve or brace, use crutches, and apply ice or heat.  Losing even a little weight can reduce knee pain. Ask your health care provider what your ideal weight is, and how to safely lose extra weight. A food expert (dietitian) may be able to help you plan your meals.  Work with a physical therapist to make a safe exercise program, as told by your health care provider. This information is not intended to replace advice given to you by your health care provider. Make sure you discuss any questions you have with your health care provider. Document Revised: 11/03/2018 Document Reviewed: 11/03/2018 Elsevier Patient Education  [employer].

## 2020-04-12 NOTE — Progress Notes (Signed)
Date:  04/12/2020   Name:  Jenna Odom   DOB:  Jan 09, 1941   MRN:  [id number]   Chief Complaint: Leg Pain (X3 weeks on and off, right knee, was walking with cane, hasnt hurt in 5-6 days,only when walking and before she gets out of bed, )  Leg Pain  The incident occurred more than 1 week ago. There was no injury mechanism. The pain is present in the right thigh and right knee. The quality of the pain is described as aching. Pertinent negatives include no inability to bear weight, loss of motion, muscle weakness, numbness or tingling. She has tried acetaminophen for the symptoms. The treatment provided mild relief.    Lab Results  Component Value Date   CREATININE 1.26 (H) 01/03/2020   BUN 16 01/03/2020   NA 138 01/03/2020   K 5.3 (H) 01/03/2020   CL 103 01/03/2020   CO2 23 01/03/2020   Lab Results  Component Value Date   CHOL 159 01/03/2020   HDL 40 01/03/2020   LDLCALC 102 (H) 01/03/2020   TRIG 92 01/03/2020   No results found for: TSH Lab Results  Component Value Date   HGBA1C 7.2 (H) 01/03/2020   Lab Results  Component Value Date   WBC 7.6 06/03/2018   HGB 11.7 (L) 06/03/2018   HCT 34.8 (L) 06/03/2018   MCV 88.4 06/03/2018   PLT 375 06/03/2018   Lab Results  Component Value Date   ALT 8 01/03/2020   AST 15 01/03/2020   ALKPHOS 69 01/03/2020   BILITOT 0.8 01/03/2020     Review of Systems  Constitutional: Negative.  Negative for chills, fatigue, fever and unexpected weight change.  HENT: Negative for congestion, ear discharge, ear pain, rhinorrhea, sinus pressure, sneezing and sore throat.   Eyes: Negative for photophobia, pain, discharge, redness and itching.  Respiratory: Negative for cough, shortness of breath, wheezing and stridor.   Cardiovascular: Negative for leg swelling.  Gastrointestinal: Negative for abdominal pain, blood in stool, constipation, diarrhea, nausea and vomiting.  Endocrine: Negative for cold intolerance, heat intolerance,  polydipsia, polyphagia and polyuria.  Genitourinary: Negative for dysuria, flank pain, frequency, hematuria, menstrual problem, pelvic pain, urgency, vaginal bleeding and vaginal discharge.  Musculoskeletal: Negative for arthralgias, back pain and myalgias.  Skin: Negative for rash.  Allergic/Immunologic: Negative for environmental allergies and food allergies.  Neurological: Negative for dizziness, tingling, weakness, light-headedness, numbness and headaches.  Hematological: Negative for adenopathy. Does not bruise/bleed easily.  Psychiatric/Behavioral: Negative for dysphoric mood. The patient is not nervous/anxious.     Patient Active Problem List   Diagnosis Date Noted  . Type 2 diabetes mellitus with hyperglycemia, with long-term current use of insulin (HCC) 02/28/2019  . Mild cognitive impairment 04/06/2018    Allergies  Allergen Reactions  . Ciprofloxacin Nausea And Vomiting    Past Surgical History:  Procedure Laterality Date  . ABDOMINAL HYSTERECTOMY      Social History   Tobacco Use  . Smoking status: Never Smoker  . Smokeless tobacco: Never Used  Vaping Use  . Vaping Use: Never used  Substance Use Topics  . Alcohol use: Never  . Drug use: Never     Medication list has been reviewed and updated.  Current Meds  Medication Sig  . Acetaminophen (TYLENOL PO) Take by mouth as needed.  Marland Kitchen apixaban (ELIQUIS) 5 MG TABS tablet Take 1 tablet (5 mg total) by mouth 2 (two) times daily. cardio  . busPIRone (BUSPAR) 5 MG tablet One half  tablet in the morning and 1 tablet at night.  . Insulin Glargine (BASAGLAR KWIKPEN) 100 UNIT/ML INJECT 15 UNITS SUBCUTANEOUSLY ONCE DAILY  . lisinopril (ZESTRIL) 2.5 MG tablet Take 1 tablet (2.5 mg total) by mouth daily.  . melatonin 3 MG TABS tablet Take 10 mg by mouth at bedtime.   . metFORMIN (GLUCOPHAGE) 1000 MG tablet Take 1 tablet (1,000 mg total) by mouth 2 (two) times daily with a meal.  . metoprolol tartrate (LOPRESSOR) 25 MG  tablet Take 1 tablet (25 mg total) by mouth 2 (two) times daily.    PHQ 2/9 Scores 04/12/2020 06/30/2019 01/17/2019  PHQ - 2 Score 0 0 1  PHQ- 9 Score 0 - 1    GAD 7 : Generalized Anxiety Score 04/12/2020  Nervous, Anxious, on Edge 0  Control/stop worrying 0  Worry too much - different things 0  Trouble relaxing 0  Restless 0  Easily annoyed or irritable 0  Afraid - awful might happen 0  Total GAD 7 Score 0  Anxiety Difficulty Not difficult at all    BP Readings from Last 3 Encounters:  04/12/20 104/80  01/03/20 130/68  06/30/19 118/78    Physical Exam Vitals and nursing note reviewed.  Constitutional:      Appearance: She is well-developed.  HENT:     Head: Normocephalic.     Right Ear: Tympanic membrane and external ear normal.     Left Ear: Tympanic membrane and external ear normal.     Nose: Nose normal.     Mouth/Throat:     Mouth: Mucous membranes are moist.  Eyes:     General: Lids are everted, no foreign bodies appreciated. No scleral icterus.       Left eye: No foreign body or hordeolum.     Conjunctiva/sclera: Conjunctivae normal.     Right eye: Right conjunctiva is not injected.     Left eye: Left conjunctiva is not injected.     Pupils: Pupils are equal, round, and reactive to light.  Neck:     Thyroid: No thyromegaly.     Vascular: No JVD.     Trachea: No tracheal deviation.  Cardiovascular:     Rate and Rhythm: Normal rate and regular rhythm.     Heart sounds: Normal heart sounds and S1 normal. No murmur heard.  No systolic murmur is present.  No diastolic murmur is present.  No friction rub. No gallop. No S3 or S4 sounds.   Pulmonary:     Effort: Pulmonary effort is normal. No respiratory distress.     Breath sounds: Normal breath sounds. No wheezing or rales.  Abdominal:     General: Bowel sounds are normal.     Palpations: Abdomen is soft. There is no mass.     Tenderness: There is no abdominal tenderness. There is no guarding or rebound.    Musculoskeletal:        General: Normal range of motion.     Cervical back: Normal range of motion and neck supple.     Right upper leg: No swelling, edema, deformity, lacerations, tenderness or bony tenderness.     Right knee: No swelling, deformity or crepitus. Normal range of motion. Tenderness present over the medial joint line and lateral joint line. Normal patellar mobility.     Instability Tests: Anterior drawer test negative.     Left knee:     Instability Tests: Anterior drawer test negative.     Right lower leg: Normal. No swelling or  tenderness. No edema.     Left lower leg: Normal. No swelling or tenderness. No edema.     Comments: popleteal tender  Lymphadenopathy:     Cervical: No cervical adenopathy.  Skin:    General: Skin is warm.     Findings: No rash.  Neurological:     Mental Status: She is alert and oriented to person, place, and time.     Cranial Nerves: No cranial nerve deficit.     Deep Tendon Reflexes: Reflexes normal.  Psychiatric:        Mood and Affect: Mood is not anxious or depressed.     Wt Readings from Last 3 Encounters:  04/12/20 152 lb (68.9 kg)  01/03/20 159 lb (72.1 kg)  06/30/19 165 lb (74.8 kg)    BP 104/80   Pulse 71   Ht 5\' 3"  (1.6 m)   Wt 152 lb (68.9 kg)   SpO2 98%   BMI 26.93 kg/m   Assessment and Plan: 1. Arthritis of right knee Chronic.  New exacerbation.  Relatively stable.  Episodic occurs day-to-day.  Patient awake sounds mornings and that she is having more pain and other days.  Patient usually takes Tylenol for control but has not been able to control it with this presently.  Exam is negative for any swelling or tenderness in the calf.  Exam is consistent with arthritis with tenderness over the joint lines and in the popliteal bursa.  We will continue Tylenol but we have added Voltaren gel to be used along the joint line as indicated to patient's caretaker. - diclofenac Sodium (VOLTAREN) 1 % GEL; Apply 2 g topically 4  (four) times daily.  Dispense: 50 g; Refill: 1

## 2020-05-10 ENCOUNTER — Other Ambulatory Visit: Payer: Self-pay

## 2020-05-10 ENCOUNTER — Ambulatory Visit (INDEPENDENT_AMBULATORY_CARE_PROVIDER_SITE_OTHER): Payer: Medicare HMO | Admitting: Family Medicine

## 2020-05-10 ENCOUNTER — Encounter: Payer: Self-pay | Admitting: Family Medicine

## 2020-05-10 VITALS — BP 122/78 | HR 62 | Ht 63.0 in | Wt 148.0 lb

## 2020-05-10 DIAGNOSIS — E119 Type 2 diabetes mellitus without complications: Secondary | ICD-10-CM | POA: Diagnosis not present

## 2020-05-10 DIAGNOSIS — Z794 Long term (current) use of insulin: Secondary | ICD-10-CM

## 2020-05-10 MED ORDER — METFORMIN HCL 1000 MG PO TABS: 1000.0000 mg | ORAL_TABLET | Freq: Two times a day (BID) | ORAL | 1 refills | Status: AC

## 2020-05-10 MED ORDER — BASAGLAR KWIKPEN 100 UNIT/ML ~~LOC~~ SOPN: 12.0000 [IU] | PEN_INJECTOR | Freq: Every day | SUBCUTANEOUS | 1 refills | Status: DC

## 2020-05-10 NOTE — Progress Notes (Signed)
23 23 23  

## 2020-05-10 NOTE — Progress Notes (Signed)
Date:  05/10/2020   Name:  Jenna Odom   DOB:  [birth year]   MRN:  [id number]   Chief Complaint: Diabetes (recheck diabetes/ A1C and foot exam)  Diabetes She presents for her follow-up diabetic visit. She has type 2 diabetes mellitus. Her disease course has been stable. There are no hypoglycemic associated symptoms. Pertinent negatives for hypoglycemia include no dizziness, headaches or nervousness/anxiousness. There are no diabetic associated symptoms. Pertinent negatives for diabetes include no chest pain, no fatigue, no polydipsia, no polyphagia, no polyuria and no weakness. There are no hypoglycemic complications. There are no diabetic complications. Risk factors for coronary artery disease include dyslipidemia, hypertension and diabetes mellitus. Current diabetic treatment includes oral agent (monotherapy). She is compliant with treatment some of the time. She is following a generally healthy diet. Meal planning includes avoidance of concentrated sweets and carbohydrate counting. She participates in exercise intermittently. Her home blood glucose trend is fluctuating minimally. Her breakfast blood glucose is taken between 8-9 am. Her breakfast blood glucose range is generally <70 mg/dl. An ACE inhibitor/angiotensin II receptor blocker is being taken. Eye exam is current.    Lab Results  Component Value Date   CREATININE 1.26 (H) 01/03/2020   BUN 16 01/03/2020   NA 138 01/03/2020   K 5.3 (H) 01/03/2020   CL 103 01/03/2020   CO2 23 01/03/2020   Lab Results  Component Value Date   CHOL 159 01/03/2020   HDL 40 01/03/2020   LDLCALC 102 (H) 01/03/2020   TRIG 92 01/03/2020   No results found for: TSH Lab Results  Component Value Date   HGBA1C 7.2 (H) 01/03/2020   Lab Results  Component Value Date   WBC 7.6 06/03/2018   HGB 11.7 (L) 06/03/2018   HCT 34.8 (L) 06/03/2018   MCV 88.4 06/03/2018   PLT 375 06/03/2018   Lab Results  Component Value Date   ALT 8 01/03/2020   AST  15 01/03/2020   ALKPHOS 69 01/03/2020   BILITOT 0.8 01/03/2020     Review of Systems  Constitutional: Negative.  Negative for chills, fatigue, fever and unexpected weight change.  HENT: Negative for congestion, ear discharge, ear pain, rhinorrhea, sinus pressure, sneezing and sore throat.   Eyes: Negative for photophobia, pain, discharge, redness and itching.  Respiratory: Negative for cough, shortness of breath, wheezing and stridor.   Cardiovascular: Negative for chest pain, palpitations and leg swelling.  Gastrointestinal: Negative for abdominal pain, blood in stool, constipation, diarrhea, nausea and vomiting.  Endocrine: Negative for cold intolerance, heat intolerance, polydipsia, polyphagia and polyuria.  Genitourinary: Negative for dysuria, flank pain, frequency, hematuria, menstrual problem, pelvic pain, urgency, vaginal bleeding and vaginal discharge.  Musculoskeletal: Negative for arthralgias, back pain and myalgias.  Skin: Negative for rash.  Allergic/Immunologic: Negative for environmental allergies and food allergies.  Neurological: Negative for dizziness, weakness, light-headedness, numbness and headaches.  Hematological: Negative for adenopathy. Does not bruise/bleed easily.  Psychiatric/Behavioral: Negative for dysphoric mood. The patient is not nervous/anxious.     Patient Active Problem List   Diagnosis Date Noted   Type 2 diabetes mellitus with hyperglycemia, with long-term current use of insulin (HCC) 02/28/2019   Mild cognitive impairment 04/06/2018    Allergies  Allergen Reactions   Ciprofloxacin Nausea And Vomiting    Past Surgical History:  Procedure Laterality Date   ABDOMINAL HYSTERECTOMY      Social History   Tobacco Use   Smoking status: Never Smoker   Smokeless tobacco: Never Used  Vaping  Use   Vaping Use: Never used  Substance Use Topics   Alcohol use: Never   Drug use: Never     Medication list has been reviewed and  updated.  Current Meds  Medication Sig   Acetaminophen (TYLENOL PO) Take by mouth as needed.   apixaban (ELIQUIS) 5 MG TABS tablet Take 1 tablet (5 mg total) by mouth 2 (two) times daily. cardio   busPIRone (BUSPAR) 5 MG tablet One half tablet in the morning and 1 tablet at night.   dapagliflozin propanediol (FARXIGA) 10 MG TABS tablet Take 1 tablet by mouth in the morning. kowalski   diclofenac Sodium (VOLTAREN) 1 % GEL Apply 2 g topically 4 (four) times daily.   Insulin Glargine (BASAGLAR KWIKPEN) 100 UNIT/ML INJECT 15 UNITS SUBCUTANEOUSLY ONCE DAILY (Patient taking differently: Inject 12 Units into the skin daily. )   lisinopril (ZESTRIL) 2.5 MG tablet Take 1 tablet (2.5 mg total) by mouth daily.   melatonin 3 MG TABS tablet Take 10 mg by mouth at bedtime.    metFORMIN (GLUCOPHAGE) 1000 MG tablet Take 1 tablet (1,000 mg total) by mouth 2 (two) times daily with a meal.   metoprolol tartrate (LOPRESSOR) 25 MG tablet Take 1 tablet (25 mg total) by mouth 2 (two) times daily.    PHQ 2/9 Scores 05/10/2020 04/12/2020 06/30/2019 01/17/2019  PHQ - 2 Score 0 0 0 1  PHQ- 9 Score 1 0 - 1    GAD 7 : Generalized Anxiety Score 05/10/2020 04/12/2020  Nervous, Anxious, on Edge 0 0  Control/stop worrying 0 0  Worry too much - different things 0 0  Trouble relaxing 0 0  Restless 0 0  Easily annoyed or irritable 0 0  Afraid - awful might happen 0 0  Total GAD 7 Score 0 0  Anxiety Difficulty - Not difficult at all    BP Readings from Last 3 Encounters:  05/10/20 122/78  04/12/20 104/80  01/03/20 130/68    Physical Exam Vitals and nursing note reviewed.  Constitutional:      General: She is not in acute distress.    Appearance: She is not diaphoretic.  HENT:     Head: Normocephalic and atraumatic.     Right Ear: External ear normal.     Left Ear: External ear normal.     Nose: Nose normal.     Mouth/Throat:     Mouth: Mucous membranes are moist.  Eyes:     General:        Right  eye: No discharge.        Left eye: No discharge.     Conjunctiva/sclera: Conjunctivae normal.     Pupils: Pupils are equal, round, and reactive to light.  Neck:     Thyroid: No thyromegaly.     Vascular: No JVD.  Cardiovascular:     Rate and Rhythm: Normal rate and regular rhythm.     Pulses:          Dorsalis pedis pulses are 1+ on the right side and 1+ on the left side.       Posterior tibial pulses are 1+ on the right side and 1+ on the left side.     Heart sounds: Normal heart sounds. No murmur heard.  No friction rub. No gallop.   Pulmonary:     Effort: Pulmonary effort is normal.     Breath sounds: Normal breath sounds. No wheezing or rhonchi.  Abdominal:     General: Bowel sounds are  normal.     Palpations: Abdomen is soft. There is no mass.     Tenderness: There is no abdominal tenderness. There is no guarding.  Musculoskeletal:        General: Normal range of motion.     Cervical back: Normal range of motion and neck supple.     Right foot: Normal range of motion. Bunion present. No deformity.     Left foot: Normal range of motion. Bunion present. No deformity.  Feet:     Right foot:     Protective Sensation: 10 sites tested. 10 sites sensed.     Skin integrity: Dry skin present. No ulcer, blister, skin breakdown, erythema, warmth, callus or fissure.     Left foot:     Protective Sensation: 10 sites tested. 10 sites sensed.     Skin integrity: Dry skin present. No ulcer, blister, skin breakdown, erythema, warmth, callus or fissure.  Lymphadenopathy:     Cervical: No cervical adenopathy.  Skin:    General: Skin is warm and dry.  Neurological:     Mental Status: She is alert.     Deep Tendon Reflexes: Reflexes are normal and symmetric.     Wt Readings from Last 3 Encounters:  05/10/20 148 lb (67.1 kg)  04/12/20 152 lb (68.9 kg)  01/03/20 159 lb (72.1 kg)    BP 122/78    Pulse 62    Ht 5\' 3"  (1.6 m)    Wt 148 lb (67.1 kg)    SpO2 98%    BMI 26.22 kg/m    Assessment and Plan: 1. Type 2 diabetes mellitus without complication, with long-term current use of insulin (HCC) Chronic.  Controlled.  Stable.  Currently patient is on 12 units of Basaglar decreased from 15 by caretaker.  Patient has fasting blood sugars in the 60 range.  Patient has an appetite that is variable due to her dementia.  We will check an A1c and we will continue her Metformin she has recently been put on Farxiga probably for cardiac as well as better control of her diabetes.  Saw him at this time okay with her decreasing her Basaglar to 12 we will check an A1c and possibly even decrease more pending the findings. - Insulin Glargine (BASAGLAR KWIKPEN) 100 UNIT/ML; Inject 0.12 mLs (12 Units total) into the skin daily.  Dispense: 15 mL; Refill: 1 - metFORMIN (GLUCOPHAGE) 1000 MG tablet; Take 1 tablet (1,000 mg total) by mouth 2 (two) times daily with a meal.  Dispense: 180 tablet; Refill: 1 - Hemoglobin A1c

## 2020-05-11 LAB — HEMOGLOBIN A1C
Est. average glucose Bld gHb Est-mCnc: 140 mg/dL
Hgb A1c MFr Bld: 6.5 % — ABNORMAL HIGH (ref 4.8–5.6)

## 2020-05-13 ENCOUNTER — Other Ambulatory Visit: Payer: Self-pay | Admitting: Family Medicine

## 2020-05-13 DIAGNOSIS — R7989 Other specified abnormal findings of blood chemistry: Secondary | ICD-10-CM

## 2020-05-21 ENCOUNTER — Ambulatory Visit: Payer: Medicare HMO | Admitting: Family Medicine

## 2020-05-27 ENCOUNTER — Other Ambulatory Visit: Payer: Self-pay | Admitting: Family Medicine

## 2020-05-27 DIAGNOSIS — R7989 Other specified abnormal findings of blood chemistry: Secondary | ICD-10-CM

## 2020-05-29 ENCOUNTER — Ambulatory Visit (INDEPENDENT_AMBULATORY_CARE_PROVIDER_SITE_OTHER): Payer: Medicare HMO

## 2020-05-29 DIAGNOSIS — Z0000 Encounter for general adult medical examination without abnormal findings: Secondary | ICD-10-CM | POA: Diagnosis not present

## 2020-05-29 NOTE — Patient Instructions (Signed)
[name] , Thank you for taking time to come for your Medicare Wellness Visit. I appreciate your ongoing commitment to your health goals. Please review the following plan we discussed and let me know if I can assist you in the future.   Screening recommendations/referrals: Colonoscopy: no longer required Mammogram: no longer required Bone Density: no longer required Recommended yearly ophthalmology/optometry visit for glaucoma screening and checkup Recommended yearly dental visit for hygiene and checkup  Vaccinations: Influenza vaccine: declined Pneumococcal vaccine: declined Tdap vaccine: due Shingles vaccine: Shingrix discussed. Please contact your pharmacy for coverage information.  Covid-19: declined  Advanced directives: Advance directive discussed with you today. I have provided a copy for you to complete at home and have notarized. Once this is complete please bring a copy in to our office so we can scan it into your chart.  Conditions/risks identified: Recommend increasing   Next appointment: Follow up in one year for your annual wellness visit    Preventive Care 65 Years and Older, Female Preventive care refers to lifestyle choices and visits with your health care provider that can promote health and wellness. What does preventive care include?  A yearly physical exam. This is also called an annual well check.  Dental exams once or twice a year.  Routine eye exams. Ask your health care provider how often you should have your eyes checked.  Personal lifestyle choices, including:  Daily care of your teeth and gums.  Regular physical activity.  Eating a healthy diet.  Avoiding tobacco and drug use.  Limiting alcohol use.  Practicing safe sex.  Taking low-dose aspirin every day.  Taking vitamin and mineral supplements as recommended by your health care provider. What happens during an annual well check? The services and screenings done by your health care  provider during your annual well check will depend on your age, overall health, lifestyle risk factors, and family history of disease. Counseling  Your health care provider may ask you questions about your:  Alcohol use.  Tobacco use.  Drug use.  Emotional well-being.  Home and relationship well-being.  Sexual activity.  Eating habits.  History of falls.  Memory and ability to understand (cognition).  Work and work Astronomer.  Reproductive health. Screening  You may have the following tests or measurements:  Height, weight, and BMI.  Blood pressure.  Lipid and cholesterol levels. These may be checked every 5 years, or more frequently if you are over 17 years old.  Skin check.  Lung cancer screening. You may have this screening every year starting at age 64 if you have a 30-pack-year history of smoking and currently smoke or have quit within the past 15 years.  Fecal occult blood test (FOBT) of the stool. You may have this test every year starting at age 68.  Flexible sigmoidoscopy or colonoscopy. You may have a sigmoidoscopy every 5 years or a colonoscopy every 10 years starting at age 39.  Hepatitis C blood test.  Hepatitis B blood test.  Sexually transmitted disease (STD) testing.  Diabetes screening. This is done by checking your blood sugar (glucose) after you have not eaten for a while (fasting). You may have this done every 1-3 years.  Bone density scan. This is done to screen for osteoporosis. You may have this done starting at age 68.  Mammogram. This may be done every 1-2 years. Talk to your health care provider about how often you should have regular mammograms. Talk with your health care provider about your test  results, treatment options, and if necessary, the need for more tests. Vaccines  Your health care provider may recommend certain vaccines, such as:  Influenza vaccine. This is recommended every year.  Tetanus, diphtheria, and acellular  pertussis (Tdap, Td) vaccine. You may need a Td booster every 10 years.  Zoster vaccine. You may need this after age 43.  Pneumococcal 13-valent conjugate (PCV13) vaccine. One dose is recommended after age 75.  Pneumococcal polysaccharide (PPSV23) vaccine. One dose is recommended after age 62. Talk to your health care provider about which screenings and vaccines you need and how often you need them. This information is not intended to replace advice given to you by your health care provider. Make sure you discuss any questions you have with your health care provider. Document Released: 09/20/2015 Document Revised: 05/13/2016 Document Reviewed: 06/25/2015 Elsevier Interactive Patient Education  2017 [employer] Prevention in the Home Falls can cause injuries. They can happen to people of all ages. There are many things you can do to make your home safe and to help prevent falls. What can I do on the outside of my home?  Regularly fix the edges of walkways and driveways and fix any cracks.  Remove anything that might make you trip as you walk through a door, such as a raised step or threshold.  Trim any bushes or trees on the path to your home.  Use bright outdoor lighting.  Clear any walking paths of anything that might make someone trip, such as rocks or tools.  Regularly check to see if handrails are loose or broken. Make sure that both sides of any steps have handrails.  Any raised decks and porches should have guardrails on the edges.  Have any leaves, snow, or ice cleared regularly.  Use sand or salt on walking paths during winter.  Clean up any spills in your garage right away. This includes oil or grease spills. What can I do in the bathroom?  Use night lights.  Install grab bars by the toilet and in the tub and shower. Do not use towel bars as grab bars.  Use non-skid mats or decals in the tub or shower.  If you need to sit down in the shower, use a plastic,  non-slip stool.  Keep the floor dry. Clean up any water that spills on the floor as soon as it happens.  Remove soap buildup in the tub or shower regularly.  Attach bath mats securely with double-sided non-slip rug tape.  Do not have throw rugs and other things on the floor that can make you trip. What can I do in the bedroom?  Use night lights.  Make sure that you have a light by your bed that is easy to reach.  Do not use any sheets or blankets that are too big for your bed. They should not hang down onto the floor.  Have a firm chair that has side arms. You can use this for support while you get dressed.  Do not have throw rugs and other things on the floor that can make you trip. What can I do in the kitchen?  Clean up any spills right away.  Avoid walking on wet floors.  Keep items that you use a lot in easy-to-reach places.  If you need to reach something above you, use a strong step stool that has a grab bar.  Keep electrical cords out of the way.  Do not use floor polish or wax that makes  floors slippery. If you must use wax, use non-skid floor wax.  Do not have throw rugs and other things on the floor that can make you trip. What can I do with my stairs?  Do not leave any items on the stairs.  Make sure that there are handrails on both sides of the stairs and use them. Fix handrails that are broken or loose. Make sure that handrails are as long as the stairways.  Check any carpeting to make sure that it is firmly attached to the stairs. Fix any carpet that is loose or worn.  Avoid having throw rugs at the top or bottom of the stairs. If you do have throw rugs, attach them to the floor with carpet tape.  Make sure that you have a light switch at the top of the stairs and the bottom of the stairs. If you do not have them, ask someone to add them for you. What else can I do to help prevent falls?  Wear shoes that:  Do not have high heels.  Have rubber  bottoms.  Are comfortable and fit you well.  Are closed at the toe. Do not wear sandals.  If you use a stepladder:  Make sure that it is fully opened. Do not climb a closed stepladder.  Make sure that both sides of the stepladder are locked into place.  Ask someone to hold it for you, if possible.  Clearly mark and make sure that you can see:  Any grab bars or handrails.  First and last steps.  Where the edge of each step is.  Use tools that help you move around (mobility aids) if they are needed. These include:  Canes.  Walkers.  Scooters.  Crutches.  Turn on the lights when you go into a dark area. Replace any light bulbs as soon as they burn out.  Set up your furniture so you have a clear path. Avoid moving your furniture around.  If any of your floors are uneven, fix them.  If there are any pets around you, be aware of where they are.  Review your medicines with your doctor. Some medicines can make you feel dizzy. This can increase your chance of falling. Ask your doctor what other things that you can do to help prevent falls. This information is not intended to replace advice given to you by your health care provider. Make sure you discuss any questions you have with your health care provider. Document Released: 06/20/2009 Document Revised: 01/30/2016 Document Reviewed: 09/28/2014 Elsevier Interactive Patient Education  2017 [employer].

## 2020-05-29 NOTE — Progress Notes (Signed)
Subjective:   Jenna Odom is a 79 y.o. female who presents for Medicare Annual (Subsequent) preventive examination.  Virtual Visit via Telephone Note  I connected with  Jenna Odom on 05/29/20 at  2:40 PM EDT by telephone and verified that I am speaking with the correct person using two identifiers.  Medicare Annual Wellness visit completed telephonically due to Covid-19 pandemic.   Location: Patient: home Provider: [facility]   I discussed the limitations, risks, security and privacy concerns of performing an evaluation and management service by telephone and the availability of in person appointments. The patient expressed understanding and agreed to proceed.  Unable to perform video visit due to video visit attempted and failed and/or patient does not have video capability.   Some vital signs may be absent or patient reported.   Reather Littler, LPN    Review of Systems     Cardiac Risk Factors include: advanced age (>26men, >16 women);diabetes mellitus;hypertension     Objective:    There were no vitals filed for this visit. There is no height or weight on file to calculate BMI.  Advanced Directives 05/29/2020 11/24/2018 09/25/2018 08/20/2018 06/03/2018 05/24/2018 02/28/2018  Does Patient Have a Medical Advance Directive? No No No No No No Yes  Type of Advance Directive - - - - - - Living will;Healthcare Power of Attorney  Would patient like information on creating a medical advance directive? Yes (MAU/Ambulatory/Procedural Areas - Information given) - - - - - -    Current Medications (verified) Outpatient Encounter Medications as of 05/29/2020  Medication Sig  . Acetaminophen (TYLENOL PO) Take by mouth as needed.  Marland Kitchen apixaban (ELIQUIS) 5 MG TABS tablet Take 1 tablet (5 mg total) by mouth 2 (two) times daily. cardio  . busPIRone (BUSPAR) 5 MG tablet One half tablet in the morning and 1 tablet at night.  . dapagliflozin propanediol (FARXIGA) 10 MG TABS tablet Take 1 tablet  by mouth in the morning. kowalski  . diclofenac Sodium (VOLTAREN) 1 % GEL Apply 2 g topically 4 (four) times daily.  . Insulin Glargine (BASAGLAR KWIKPEN) 100 UNIT/ML Inject 0.12 mLs (12 Units total) into the skin daily.  . melatonin 3 MG TABS tablet Take 10 mg by mouth at bedtime.   . metFORMIN (GLUCOPHAGE) 1000 MG tablet Take 1 tablet (1,000 mg total) by mouth 2 (two) times daily with a meal.  . metoprolol tartrate (LOPRESSOR) 25 MG tablet Take 1 tablet (25 mg total) by mouth 2 (two) times daily.  . nitrofurantoin, macrocrystal-monohydrate, (MACROBID) 100 MG capsule Take 100 mg by mouth 2 (two) times daily.  Marland Kitchen lisinopril (ZESTRIL) 2.5 MG tablet Take 1 tablet by mouth once daily   No facility-administered encounter medications on file as of 05/29/2020.    Allergies (verified) Ciprofloxacin   History: Past Medical History:  Diagnosis Date  . A-fib (HCC)   . Anxiety   . Diabetes mellitus without complication (HCC)   . Hypertension    Past Surgical History:  Procedure Laterality Date  . ABDOMINAL HYSTERECTOMY     Family History  Problem Relation Age of Onset  . Dementia Mother    Social History   Socioeconomic History  . Marital status: Widowed    Spouse name: Not on file  . Number of children: Not on file  . Years of education: Not on file  . Highest education level: Not on file  Occupational History  . Not on file  Tobacco Use  . Smoking status: Never Smoker  .  Smokeless tobacco: Never Used  Vaping Use  . Vaping Use: Never used  Substance and Sexual Activity  . Alcohol use: Never  . Drug use: Never  . Sexual activity: Not on file  Other Topics Concern  . Not on file  Social History Narrative  . Not on file   Social Determinants of Health   Financial Resource Strain: Low Risk   . Difficulty of Paying Living Expenses: Not hard at all  Food Insecurity: No Food Insecurity  . Worried About Programme researcher, broadcasting/film/video in the Last Year: Never true  . Ran Out of Food in  the Last Year: Never true  Transportation Needs: No Transportation Needs  . Lack of Transportation (Medical): No  . Lack of Transportation (Non-Medical): No  Physical Activity: Inactive  . Days of Exercise per Week: 0 days  . Minutes of Exercise per Session: 0 min  Stress: No Stress Concern Present  . Feeling of Stress : Not at all  Social Connections: Moderately Isolated  . Frequency of Communication with Friends and Family: More than three times a week  . Frequency of Social Gatherings with Friends and Family: Three times a week  . Attends Religious Services: More than 4 times per year  . Active Member of Clubs or Organizations: No  . Attends Banker Meetings: Never  . Marital Status: Widowed    Tobacco Counseling Counseling given: Not Answered   Clinical Intake:  Pre-visit preparation completed: Yes  Pain : No/denies pain     Nutritional Risks: None Diabetes: Yes CBG done?: No Did pt. bring in CBG monitor from home?: No  How often do you need to have someone help you when you read instructions, pamphlets, or other written materials from your doctor or pharmacy?: 1 - Never  Nutrition Risk Assessment:  Has the patient had any N/V/D within the last 2 months?  No  Does the patient have any non-healing wounds?  No  Has the patient had any unintentional weight loss or weight gain?  No   Diabetes:  Is the patient diabetic?  Yes  If diabetic, was a CBG obtained today?  No  Did the patient bring in their glucometer from home?  No  How often do you monitor your CBG's? Twice weekly.   Financial Strains and Diabetes Management:  Are you having any financial strains with the device, your supplies or your medication? No .  Does the patient want to be seen by Chronic Care Management for management of their diabetes?  No  Would the patient like to be referred to a Nutritionist or for Diabetic Management?  No   Diabetic Exams:  Diabetic Eye Exam: Completed  10/09/19.  Diabetic Foot Exam: Completed 05/10/20.    Interpreter Needed?: No  Information entered by :: Reather Littler LPN   Activities of Daily Living In your present state of health, do you have any difficulty performing the following activities: 05/29/2020 06/30/2019  Hearing? Y N  Comment declines hearing aids -  Vision? N N  Difficulty concentrating or making decisions? N N  Walking or climbing stairs? N N  Dressing or bathing? N N  Doing errands, shopping? N N  Preparing Food and eating ? N -  Using the Toilet? N -  In the past six months, have you accidently leaked urine? Y -  Do you have problems with loss of bowel control? Y -  Managing your Medications? N -  Managing your Finances? N -  Housekeeping or  managing your Housekeeping? N -  Some recent data might be hidden    Patient Care Team: Duanne Limerick, MD as PCP - General (Family Medicine)  Indicate any recent Medical Services you may have received from other than [facility] providers in the past year (date may be approximate).     Assessment:   This is a routine wellness examination for Center.  Hearing/Vision screen  Hearing Screening   125Hz  250Hz  500Hz  1000Hz  2000Hz  3000Hz  4000Hz  6000Hz  8000Hz   Right ear:           Left ear:           Comments: Pt has mild hearing difficulty  Vision Screening Comments: [facility] screen[facility] at [facility]  Dietary issues and exercise activities discussed: Current Exercise Habits: The patient does not participate in regular exercise at present, Exercise limited by: None identified  Goals    . Increase physical activity     Recommend increasing physical activity to at least 3 days per week      Depression Screen PHQ 2/9 Scores 05/29/2020 05/10/2020 04/12/2020 06/30/2019 01/17/2019  PHQ - 2 Score 0 0 0 0 1  PHQ- 9 Score - 1 0 - 1    Fall Risk Fall Risk  05/29/2020 05/10/2020 04/12/2020 06/30/2019 01/17/2019  Falls in the past year? 0 0 0 0 1  Number falls in past  yr: 0 - - 0 0  Injury with Fall? 0 - - 0 0  Risk for fall due to : No Fall Risks - No Fall Risks - -  Follow up Falls prevention discussed Falls evaluation completed Falls evaluation completed - Falls evaluation completed    Any stairs in or around the home? Yes  If so, are there any without handrails? No  Home free of loose throw rugs in walkways, pet beds, electrical cords, etc? Yes  Adequate lighting in your home to reduce risk of falls? Yes   ASSISTIVE DEVICES UTILIZED TO PREVENT FALLS:  Life alert? No  Use of a cane, walker or w/c? No  Grab bars in the bathroom? No  Shower chair or bench in shower? Yes  Elevated toilet seat or a handicapped toilet? Yes   TIMED UP AND GO:  Was the test performed? No . Telephonic visit.   Cognitive Function:        Immunizations  There is no immunization history on file for this patient.  TDAP status: Due, Education has been provided regarding the importance of this vaccine. Advised may receive this vaccine at local pharmacy or Health Dept. Aware to provide a copy of the vaccination record if obtained from local pharmacy or Health Dept. Verbalized acceptance and understanding.   Flu Vaccine status: Declined, Education has been provided regarding the importance of this vaccine but patient still declined. Advised may receive this vaccine at local pharmacy or Health Dept. Aware to provide a copy of the vaccination record if obtained from local pharmacy or Health Dept. Verbalized acceptance and understanding.   Pneumococcal vaccine status: Declined,  Education has been provided regarding the importance of this vaccine but patient still declined. Advised may receive this vaccine at local pharmacy or Health Dept. Aware to provide a copy of the vaccination record if obtained from local pharmacy or Health Dept. Verbalized acceptance and understanding.    Covid-19 vaccine status: Declined, Education has been provided regarding the importance of this  vaccine but patient still declined. Advised may receive this vaccine at local pharmacy or Health Dept.or  vaccine clinic. Aware to provide a copy of the vaccination record if obtained from local pharmacy or Health Dept. Verbalized acceptance and understanding.  Qualifies for Shingles Vaccine? Yes   Zostavax completed No   Shingrix Completed?: No.    Education has been provided regarding the importance of this vaccine. Patient has been advised to call insurance company to determine out of pocket expense if they have not yet received this vaccine. Advised may also receive vaccine at local pharmacy or Health Dept. Verbalized acceptance and understanding.  Screening Tests Health Maintenance  Topic Date Due  . COVID-19 Vaccine (1) Never done  . INFLUENZA VACCINE  12/05/2020 (Originally 04/07/2020)  . TETANUS/TDAP  04/12/2021 (Originally 03/19/1960)  . Hepatitis C Screening  04/12/2021 (Originally May 10, 1941)  . PNA vac Low Risk Adult (1 of 2 - PCV13) 04/12/2021 (Originally 03/19/2006)  . DEXA SCAN  05/10/2021 (Originally 03/19/2006)  . OPHTHALMOLOGY EXAM  10/08/2020  . HEMOGLOBIN A1C  11/07/2020  . FOOT EXAM  05/10/2021    Health Maintenance  Health Maintenance Due  Topic Date Due  . COVID-19 Vaccine (1) Never done    Colorectal cancer screening: No longer required.    Mammogram status: No longer required.    Bone density status: no longer required  Lung Cancer Screening: (Low Dose CT Chest recommended if Age 42-80 years, 30 pack-year currently smoking OR have quit w/in 15years.) does not qualify.   Additional Screening:  Hepatitis C Screening: does qualify; postponed  Vision Screening: Recommended annual ophthalmology exams for early detection of glaucoma and other disorders of the eye. Is the patient up to date with their annual eye exam?  Yes  Who is the provider or what is the name of the office in which the patient attends annual eye exams? [facility]  Dental Screening:  Recommended annual dental exams for proper oral hygiene  Community Resource Referral / Chronic Care Management: CRR required this visit?  No   CCM required this visit?  No      Plan:     I have personally reviewed and noted the following in the patient's chart:   . Medical and social history . Use of alcohol, tobacco or illicit drugs  . Current medications and supplements . Functional ability and status . Nutritional status . Physical activity . Advanced directives . List of other physicians . Hospitalizations, surgeries, and ER visits in previous 12 months . Vitals . Screenings to include cognitive, depression, and falls . Referrals and appointments  In addition, I have reviewed and discussed with patient certain preventive protocols, quality metrics, and best practice recommendations. A written personalized care plan for preventive services as well as general preventive health recommendations were provided to patient.     Reather LittlerKasey Agatha Duplechain, LPN   1/61/09609/22/2021   Nurse Notes: pt accompanied during visit by her daughter Inetta Fermoina. Pt recently diagnosed with UTI and started abx today.

## 2020-06-15 ENCOUNTER — Other Ambulatory Visit: Payer: Self-pay | Admitting: Family Medicine

## 2020-06-15 DIAGNOSIS — R7989 Other specified abnormal findings of blood chemistry: Secondary | ICD-10-CM

## 2020-06-15 NOTE — Telephone Encounter (Signed)
Requested Prescriptions  Pending Prescriptions Disp Refills  . lisinopril (ZESTRIL) 2.5 MG tablet [Pharmacy Med Name: Lisinopril 2.5 MG Oral Tablet] 30 tablet 0    Sig: Take 1 tablet by mouth once daily     Cardiovascular:  ACE Inhibitors Failed - 06/15/2020 12:14 PM      Failed - Cr in normal range and within 180 days    Creatinine, Ser  Date Value Ref Range Status  01/03/2020 1.26 (H) 0.57 - 1.00 mg/dL Final         Failed - K in normal range and within 180 days    Potassium  Date Value Ref Range Status  01/03/2020 5.3 (H) 3.5 - 5.2 mmol/L Final         Passed - Patient is not pregnant      Passed - Last BP in normal range    BP Readings from Last 1 Encounters:  05/10/20 122/78         Passed - Valid encounter within last 6 months    Recent Outpatient Visits          1 month ago Type 2 diabetes mellitus without complication, with long-term current use of insulin (HCC)   [facility] Duanne Limerick, MD   2 months ago Arthritis of right knee   [facility] Duanne Limerick, MD   5 months ago Type 2 diabetes mellitus without complication, with long-term current use of insulin (HCC)   [facility] Duanne Limerick, MD   11 months ago Type 2 diabetes mellitus without complication, with long-term current use of insulin (HCC)   [facility] Duanne Limerick, MD   1 year ago Urethritis   [facility] Duanne Limerick, MD      Future Appointments            In 2 months Duanne Limerick, MD [facility]

## 2020-06-25 ENCOUNTER — Encounter: Payer: Self-pay | Admitting: Family Medicine

## 2020-06-26 ENCOUNTER — Ambulatory Visit
Admission: EM | Admit: 2020-06-26 | Discharge: 2020-06-26 | Disposition: A | Discharge status: Home / Self Care | Payer: Medicare HMO | Attending: Family Medicine | Admitting: Family Medicine

## 2020-06-26 ENCOUNTER — Encounter: Payer: Self-pay | Admitting: Emergency Medicine

## 2020-06-26 ENCOUNTER — Other Ambulatory Visit: Payer: Self-pay

## 2020-06-26 DIAGNOSIS — B379 Candidiasis, unspecified: Secondary | ICD-10-CM | POA: Insufficient documentation

## 2020-06-26 DIAGNOSIS — N3001 Acute cystitis with hematuria: Secondary | ICD-10-CM | POA: Diagnosis present

## 2020-06-26 HISTORY — DX: Unspecified dementia, unspecified severity, without behavioral disturbance, psychotic disturbance, mood disturbance, and anxiety: F03.90

## 2020-06-26 LAB — URINALYSIS, COMPLETE (UACMP) WITH MICROSCOPIC
Bilirubin Urine: NEGATIVE
Glucose, UA: >1000 mg/dL — AB
Nitrite: POSITIVE — AB
Protein, ur: 30 mg/dL — AB
Specific Gravity, Urine: 1.015 (ref 1.005–1.030)
Squamous Epithelial / HPF: NONE SEEN (ref 0–5)
WBC, UA: >50 WBC/hpf (ref 0–5)
pH: 8.5 — ABNORMAL HIGH (ref 5.0–8.0)

## 2020-06-26 MED ORDER — FLUCONAZOLE 150 MG PO TABS: 150.0000 mg | ORAL_TABLET | Freq: Once | ORAL | 0 refills | Status: AC

## 2020-06-26 MED ORDER — CEPHALEXIN 500 MG PO CAPS: 500.0000 mg | ORAL_CAPSULE | Freq: Two times a day (BID) | ORAL | 0 refills | Status: DC

## 2020-06-26 NOTE — ED Provider Notes (Signed)
[facility]    CSN: [id number] Arrival date & time: 06/26/20  5621  History   Chief Complaint Chief Complaint  Patient presents with  . Abdominal Pain  . Back Pain   HPI  79 year old presents with abdominal pain.  Patient has dementia and thus history is obtained primarily from the daughter.  1.5 day history of abdominal pain. Located in the lower abdomen.  Patient denied back pain to me.  No reported urinary difficulty.  Daughter states that she has been constipated.  Has given MiraLAX with improvement.  Daughter reports that she has a history of UTI.  She thinks that this may be the case.  No fever.  No other associated symptoms.  Past Medical History:  Diagnosis Date  . A-fib (HCC)   . Anxiety   . Dementia (HCC)   . Diabetes mellitus without complication (HCC)   . Hypertension     Patient Active Problem List   Diagnosis Date Noted  . Benign essential hypertension 02/18/2020  . Type 2 diabetes mellitus with hyperglycemia, with long-term current use of insulin (HCC) 02/28/2019  . Mild cognitive impairment 04/06/2018    Past Surgical History:  Procedure Laterality Date  . ABDOMINAL HYSTERECTOMY      OB History   No obstetric history on file.      Home Medications    Prior to Admission medications   Medication Sig Start Date End Date Taking? Authorizing Provider  Acetaminophen (TYLENOL PO) Take by mouth as needed.   Yes [provider]  apixaban (ELIQUIS) 5 MG TABS tablet Take 1 tablet (5 mg total) by mouth 2 (two) times daily. cardio 06/30/19  Yes Duanne Limerick, MD  busPIRone (BUSPAR) 5 MG tablet One half tablet in the morning and 1 tablet at night. 01/03/20  Yes Duanne Limerick, MD  dapagliflozin propanediol (FARXIGA) 10 MG TABS tablet Take 1 tablet by mouth in the morning. Gwen Pounds 04/23/20 04/23/21 Yes [provider]  diclofenac Sodium (VOLTAREN) 1 % GEL Apply 2 g topically 4 (four) times daily. 04/12/20  Yes Duanne Limerick, MD    Insulin Glargine (BASAGLAR KWIKPEN) 100 UNIT/ML Inject 0.12 mLs (12 Units total) into the skin daily. 05/10/20  Yes Duanne Limerick, MD  lisinopril (ZESTRIL) 2.5 MG tablet Take 1 tablet by mouth once daily 06/15/20  Yes Duanne Limerick, MD  melatonin 3 MG TABS tablet Take 10 mg by mouth at bedtime.    Yes [provider]  metFORMIN (GLUCOPHAGE) 1000 MG tablet Take 1 tablet (1,000 mg total) by mouth 2 (two) times daily with a meal. 05/10/20  Yes Duanne Limerick, MD  metoprolol tartrate (LOPRESSOR) 25 MG tablet Take 1 tablet (25 mg total) by mouth 2 (two) times daily. 01/03/20  Yes Duanne Limerick, MD  cephALEXin (KEFLEX) 500 MG capsule Take 1 capsule (500 mg total) by mouth 2 (two) times daily. 06/26/20   Tommie Sams, DO  fluconazole (DIFLUCAN) 150 MG tablet Take 1 tablet (150 mg total) by mouth once for 1 dose. Repeat dose in 72 hours. 06/26/20 06/26/20  Tommie Sams, DO    Family History Family History  Problem Relation Age of Onset  . Dementia Mother   . Other Father        gun shot    Social History Social History   Tobacco Use  . Smoking status: Never Smoker  . Smokeless tobacco: Never Used  Vaping Use  . Vaping Use: Never used  Substance Use  Topics  . Alcohol use: Never  . Drug use: Never     Allergies   Ciprofloxacin   Review of Systems Review of Systems  Constitutional: Negative for fever.  Gastrointestinal: Positive for abdominal pain.   Physical Exam Triage Vital Signs ED Triage Vitals  Enc Vitals Group     BP 06/26/20 0831 111/73     Pulse Rate 06/26/20 0831 96     Resp 06/26/20 0831 18     Temp 06/26/20 0831 97.9 F (36.6 C)     Temp Source 06/26/20 0831 Oral     SpO2 06/26/20 0831 99 %     Weight 06/26/20 0831 144 lb (65.3 kg)     Height 06/26/20 0831 5\' 3"  (1.6 m)     Head Circumference --      Peak Flow --      Pain Score 06/26/20 0830 8     Pain Loc --      Pain Edu? --      Excl. in GC? --    Updated Vital Signs BP 111/73 (BP  Location: Left Arm)   Pulse 96   Temp 97.9 F (36.6 C) (Oral)   Resp 18   Ht 5\' 3"  (1.6 m)   Wt 65.3 kg   SpO2 99%   BMI 25.51 kg/m   Visual Acuity Right Eye Distance:   Left Eye Distance:   Bilateral Distance:    Right Eye Near:   Left Eye Near:    Bilateral Near:     Physical Exam Vitals and nursing note reviewed.  Constitutional:      General: She is not in acute distress.    Appearance: Normal appearance. She is not ill-appearing.  HENT:     Head: Normocephalic and atraumatic.  Eyes:     General:        Right eye: No discharge.        Left eye: No discharge.     Conjunctiva/sclera: Conjunctivae normal.  Pulmonary:     Effort: Pulmonary effort is normal. No respiratory distress.  Abdominal:     General: There is no distension.     Palpations: Abdomen is soft.     Tenderness: There is no right CVA tenderness or left CVA tenderness.     Comments: Tenderness to palpation in the suprapubic region, LLQ and RLQ.   Neurological:     Mental Status: She is alert.  Psychiatric:        Mood and Affect: Mood normal.        Behavior: Behavior normal.    UC Treatments / Results  Labs (all labs ordered are listed, but only abnormal results are displayed) Labs Reviewed  URINALYSIS, COMPLETE (UACMP) WITH MICROSCOPIC - Abnormal; Notable for the following components:      Result Value   APPearance CLOUDY (*)    pH 8.5 (*)    Glucose, UA >1,000 (*)    Hgb urine dipstick TRACE (*)    Ketones, ur TRACE (*)    Protein, ur 30 (*)    Nitrite POSITIVE (*)    Leukocytes,Ua SMALL (*)    Bacteria, UA MANY (*)    All other components within normal limits  URINE CULTURE    EKG   Radiology No results found.  Procedures Procedures (including critical care time)  Medications Ordered in UC Medications - No data to display  Initial Impression / Assessment and Plan / UC Course  I have reviewed the triage vital signs and the  nursing notes.  Pertinent labs & imaging  results that were available during my care of the patient were reviewed by me and considered in my medical decision making (see chart for details).    79 year old female presents with lower abdominal pain.  Has a history of UTI.  Urinalysis and microscopy consistent with UTI.  Sending culture.  Treating with Keflex.  Yeast noted in urine as well.  Treating with Diflucan.  Final Clinical Impressions(s) / UC Diagnoses   Final diagnoses:  Acute cystitis with hematuria  Yeast infection     Discharge Instructions     You have a UTI which is causing your pain.  Medications as prescribed.  If you develop fever go to the hospital.  Take care  Dr. Adriana Simas     ED Prescriptions    Medication Sig Dispense Auth. Provider   cephALEXin (KEFLEX) 500 MG capsule Take 1 capsule (500 mg total) by mouth 2 (two) times daily. 14 capsule Luceil Herrin G, DO   fluconazole (DIFLUCAN) 150 MG tablet Take 1 tablet (150 mg total) by mouth once for 1 dose. Repeat dose in 72 hours. 2 tablet Tommie Sams, DO     PDMP not reviewed this encounter.   Everlene Other [place], [place] 06/26/20 [phone]

## 2020-06-26 NOTE — Discharge Instructions (Signed)
You have a UTI which is causing your pain.  Medications as prescribed.  If you develop fever go to the hospital.  Take care  Dr. Adriana Simas

## 2020-06-26 NOTE — ED Triage Notes (Signed)
Patient in today with her daughter who states patient is c/o abdominal pain and back pain x 2 days. Patient hadn't had a BM for 24 hours, daughter gave Miralax and patient had a BM last night. Daughter states patient has had UTIs in the past.

## 2020-06-29 LAB — URINE CULTURE: Culture: >=100000 — AB

## 2020-07-01 ENCOUNTER — Encounter: Payer: Self-pay | Admitting: Family Medicine

## 2020-07-01 ENCOUNTER — Telehealth: Payer: Self-pay

## 2020-07-01 NOTE — Telephone Encounter (Signed)
Daughter Inetta Fermo was asking for FMLA papers to be filled out. I explained to her that we can fill the papers out to state we see her mom/ the pt every 4 months for diabetes/ routine appt/ med refill for 4 hrs each visit. She is going to speak to her pcp and then let us know

## 2020-07-15 ENCOUNTER — Other Ambulatory Visit: Payer: Self-pay | Admitting: Family Medicine

## 2020-07-15 DIAGNOSIS — R7989 Other specified abnormal findings of blood chemistry: Secondary | ICD-10-CM

## 2020-07-15 NOTE — Telephone Encounter (Signed)
Requested medication (s) are due for refill today: yes  Requested medication (s) are on the active medication list: yes  Last refill:  06/15/20 #30 0 refills  Future visit scheduled: yes in 1 month   Notes to clinic:   overdue labs, last 01/03/20. Can we still give courtesy refill?  Future  OV in 1 month      Requested Prescriptions  Pending Prescriptions Disp Refills   lisinopril (ZESTRIL) 2.5 MG tablet [Pharmacy Med Name: Lisinopril 2.5 MG Oral Tablet] 30 tablet 0    Sig: Take 1 tablet by mouth once daily      Cardiovascular:  ACE Inhibitors Failed - 07/15/2020  9:42 AM      Failed - Cr in normal range and within 180 days    Creatinine, Ser  Date Value Ref Range Status  01/03/2020 1.26 (H) 0.57 - 1.00 mg/dL Final          Failed - K in normal range and within 180 days    Potassium  Date Value Ref Range Status  01/03/2020 5.3 (H) 3.5 - 5.2 mmol/L Final          Passed - Patient is not pregnant      Passed - Last BP in normal range    BP Readings from Last 1 Encounters:  06/26/20 111/73          Passed - Valid encounter within last 6 months    Recent Outpatient Visits           2 months ago Type 2 diabetes mellitus without complication, with long-term current use of insulin (HCC)   [facility] Duanne Limerick, MD   3 months ago Arthritis of right knee   [facility] Duanne Limerick, MD   6 months ago Type 2 diabetes mellitus without complication, with long-term current use of insulin (HCC)   [facility] Duanne Limerick, MD   1 year ago Type 2 diabetes mellitus without complication, with long-term current use of insulin (HCC)   [facility] Duanne Limerick, MD   1 year ago Urethritis   [facility] Duanne Limerick, MD       Future Appointments             In 1 month Duanne Limerick, MD [facility], [facility]

## 2020-07-16 ENCOUNTER — Other Ambulatory Visit: Payer: Self-pay | Admitting: Family Medicine

## 2020-07-16 DIAGNOSIS — I4811 Longstanding persistent atrial fibrillation: Secondary | ICD-10-CM

## 2020-08-12 ENCOUNTER — Other Ambulatory Visit: Payer: Self-pay | Admitting: Family Medicine

## 2020-08-12 ENCOUNTER — Encounter: Payer: Self-pay | Admitting: Family Medicine

## 2020-08-12 DIAGNOSIS — R7989 Other specified abnormal findings of blood chemistry: Secondary | ICD-10-CM

## 2020-08-23 ENCOUNTER — Emergency Department
Admission: EM | Admit: 2020-08-23 | Discharge: 2020-08-23 | Disposition: A | Discharge status: Home / Self Care | Payer: Medicare HMO | Attending: Emergency Medicine | Admitting: Emergency Medicine

## 2020-08-23 ENCOUNTER — Emergency Department: Payer: Medicare HMO

## 2020-08-23 ENCOUNTER — Encounter: Payer: Self-pay | Admitting: Emergency Medicine

## 2020-08-23 ENCOUNTER — Ambulatory Visit
Admission: EM | Admit: 2020-08-23 | Discharge: 2020-08-23 | Disposition: A | Discharge status: Home / Self Care | Payer: Medicare HMO | Attending: Sports Medicine | Admitting: Sports Medicine

## 2020-08-23 ENCOUNTER — Other Ambulatory Visit: Payer: Self-pay

## 2020-08-23 DIAGNOSIS — Z7901 Long term (current) use of anticoagulants: Secondary | ICD-10-CM | POA: Insufficient documentation

## 2020-08-23 DIAGNOSIS — W19XXXA Unspecified fall, initial encounter: Secondary | ICD-10-CM

## 2020-08-23 DIAGNOSIS — E11649 Type 2 diabetes mellitus with hypoglycemia without coma: Secondary | ICD-10-CM | POA: Insufficient documentation

## 2020-08-23 DIAGNOSIS — Z23 Encounter for immunization: Secondary | ICD-10-CM | POA: Insufficient documentation

## 2020-08-23 DIAGNOSIS — Y92009 Unspecified place in unspecified non-institutional (private) residence as the place of occurrence of the external cause: Secondary | ICD-10-CM | POA: Diagnosis not present

## 2020-08-23 DIAGNOSIS — I10 Essential (primary) hypertension: Secondary | ICD-10-CM | POA: Insufficient documentation

## 2020-08-23 DIAGNOSIS — S0101XA Laceration without foreign body of scalp, initial encounter: Secondary | ICD-10-CM

## 2020-08-23 DIAGNOSIS — W01198A Fall on same level from slipping, tripping and stumbling with subsequent striking against other object, initial encounter: Secondary | ICD-10-CM | POA: Diagnosis not present

## 2020-08-23 DIAGNOSIS — I959 Hypotension, unspecified: Secondary | ICD-10-CM | POA: Diagnosis not present

## 2020-08-23 DIAGNOSIS — Z7984 Long term (current) use of oral hypoglycemic drugs: Secondary | ICD-10-CM | POA: Diagnosis not present

## 2020-08-23 DIAGNOSIS — Z794 Long term (current) use of insulin: Secondary | ICD-10-CM | POA: Diagnosis not present

## 2020-08-23 DIAGNOSIS — S0990XA Unspecified injury of head, initial encounter: Secondary | ICD-10-CM | POA: Diagnosis present

## 2020-08-23 DIAGNOSIS — Z79899 Other long term (current) drug therapy: Secondary | ICD-10-CM | POA: Insufficient documentation

## 2020-08-23 DIAGNOSIS — E162 Hypoglycemia, unspecified: Secondary | ICD-10-CM

## 2020-08-23 LAB — CBC WITH DIFFERENTIAL/PLATELET
Abs Immature Granulocytes: 0.02 10*3/uL (ref 0.00–0.07)
Basophils Absolute: 0 10*3/uL (ref 0.0–0.1)
Basophils Relative: 0 %
Eosinophils Absolute: 0.2 10*3/uL (ref 0.0–0.5)
Eosinophils Relative: 2 %
HCT: 41.3 % (ref 36.0–46.0)
Hemoglobin: 13.9 g/dL (ref 12.0–15.0)
Immature Granulocytes: 0 %
Lymphocytes Relative: 34 %
Lymphs Abs: 2.6 10*3/uL (ref 0.7–4.0)
MCH: 33.7 pg (ref 26.0–34.0)
MCHC: 33.7 g/dL (ref 30.0–36.0)
MCV: 100 fL (ref 80.0–100.0)
Monocytes Absolute: 0.3 10*3/uL (ref 0.1–1.0)
Monocytes Relative: 4 %
Neutro Abs: 4.4 10*3/uL (ref 1.7–7.7)
Neutrophils Relative %: 60 %
Platelets: 223 10*3/uL (ref 150–400)
RBC: 4.13 MIL/uL (ref 3.87–5.11)
RDW: 16 % — ABNORMAL HIGH (ref 11.5–15.5)
WBC: 7.5 10*3/uL (ref 4.0–10.5)
nRBC: 0 % (ref 0.0–0.2)

## 2020-08-23 LAB — COMPREHENSIVE METABOLIC PANEL
ALT: 9 U/L (ref 0–44)
AST: 20 U/L (ref 15–41)
Albumin: 4 g/dL (ref 3.5–5.0)
Alkaline Phosphatase: 50 U/L (ref 38–126)
Anion gap: 10 (ref 5–15)
BUN: 25 mg/dL — ABNORMAL HIGH (ref 8–23)
CO2: 24 mmol/L (ref 22–32)
Calcium: 8.9 mg/dL (ref 8.9–10.3)
Chloride: 102 mmol/L (ref 98–111)
Creatinine, Ser: 0.99 mg/dL (ref 0.44–1.00)
GFR, Estimated: 58 mL/min — ABNORMAL LOW (ref >60)
Glucose, Bld: 128 mg/dL — ABNORMAL HIGH (ref 70–99)
Potassium: 4.5 mmol/L (ref 3.5–5.1)
Sodium: 136 mmol/L (ref 135–145)
Total Bilirubin: 1.1 mg/dL (ref 0.3–1.2)
Total Protein: 6.7 g/dL (ref 6.5–8.1)

## 2020-08-23 LAB — CBG MONITORING, ED
Glucose-Capillary: 118 mg/dL — ABNORMAL HIGH (ref 70–99)
Glucose-Capillary: 89 mg/dL (ref 70–99)

## 2020-08-23 MED ORDER — TETANUS-DIPHTH-ACELL PERTUSSIS 5-2.5-18.5 LF-MCG/0.5 IM SUSY
0.5000 mL | PREFILLED_SYRINGE | Freq: Once | INTRAMUSCULAR | Status: AC
  Administered 2020-08-23: 13:00:00 0.5 mL via INTRAMUSCULAR
  Filled 2020-08-23: qty 0.5

## 2020-08-23 MED ORDER — LIDOCAINE HCL (PF) 1 % IJ SOLN
5.0000 mL | Freq: Once | INTRAMUSCULAR | Status: AC
  Administered 2020-08-23: 14:00:00 5 mL via INTRADERMAL
  Filled 2020-08-23: qty 5

## 2020-08-23 MED ORDER — LIDOCAINE-EPINEPHRINE-TETRACAINE (LET) TOPICAL GEL: 3.0000 mL | Freq: Once | TOPICAL | Status: DC

## 2020-08-23 MED ORDER — LACTATED RINGERS IV BOLUS
1000.0000 mL | Freq: Once | INTRAVENOUS | Status: AC
  Administered 2020-08-23: 13:00:00 1000 mL via INTRAVENOUS

## 2020-08-23 NOTE — ED Triage Notes (Signed)
Patient presents to [facility] with daughter. States that she fell around 6 am this morning and hit her head on a dresser. Daughter states that she had a blood sugar of 40 once she got her cleaned up. States that she is currently on eliquis. Patient has a laceration to the back left side of her head. Daughter states that she did give her tylenol due to her being in pain.

## 2020-08-23 NOTE — ED Notes (Signed)
No e-sig obtained due to no e-pad sig available.  Pt and family verbalized understanding of all discharge instructions and f/u care.

## 2020-08-23 NOTE — Discharge Instructions (Addendum)
CT not available - transfer to [facility] for further evaluation and management

## 2020-08-23 NOTE — ED Notes (Signed)
A 

## 2020-08-23 NOTE — ED Provider Notes (Signed)
[facility]    CSN: [id number] Arrival date & time: 08/23/20  0354      History   Chief Complaint Chief Complaint  Patient presents with  . Fall  . Head Laceration    HPI Jenna Odom is a 79 y.o. female.   79 yo female presents with her daughter for evaluation of a closed head injury.  History obtained from her daughter as the patient has dementia.  Daughter reports that the patient fell around 6am this morning.  She has DM and her glucose was 40 at the time of the fall.  Complicating her situation is that she is on eliquis for afib - followed by Dr. Gwen Pounds [facility] clinic).     Past Medical History:  Diagnosis Date  . A-fib (HCC)   . Anxiety   . Dementia (HCC)   . Diabetes mellitus without complication (HCC)   . Hypertension     Patient Active Problem List   Diagnosis Date Noted  . Benign essential hypertension 02/18/2020  . Type 2 diabetes mellitus with hyperglycemia, with long-term current use of insulin (HCC) 02/28/2019  . Mild cognitive impairment 04/06/2018    Past Surgical History:  Procedure Laterality Date  . ABDOMINAL HYSTERECTOMY      OB History   No obstetric history on file.      Home Medications    Prior to Admission medications   Medication Sig Start Date End Date Taking? Authorizing Provider  Acetaminophen (TYLENOL PO) Take by mouth as needed.   Yes [provider]  apixaban (ELIQUIS) 5 MG TABS tablet Take 1 tablet (5 mg total) by mouth 2 (two) times daily. cardio 06/30/19  Yes Duanne Limerick, MD  busPIRone (BUSPAR) 5 MG tablet One half tablet in the morning and 1 tablet at night. 01/03/20  Yes Duanne Limerick, MD  dapagliflozin propanediol (FARXIGA) 10 MG TABS tablet Take 1 tablet by mouth in the morning. Gwen Pounds 04/23/20 04/23/21 Yes [provider]  diclofenac Sodium (VOLTAREN) 1 % GEL Apply 2 g topically 4 (four) times daily. 04/12/20  Yes Duanne Limerick, MD  Insulin Glargine (BASAGLAR KWIKPEN) 100  UNIT/ML Inject 0.12 mLs (12 Units total) into the skin daily. 05/10/20  Yes Duanne Limerick, MD  lisinopril (ZESTRIL) 2.5 MG tablet Take 1 tablet by mouth once daily 08/12/20  Yes Duanne Limerick, MD  melatonin 3 MG TABS tablet Take 10 mg by mouth at bedtime.    Yes [provider]  metFORMIN (GLUCOPHAGE) 1000 MG tablet Take 1 tablet (1,000 mg total) by mouth 2 (two) times daily with a meal. 05/10/20  Yes Duanne Limerick, MD  metoprolol tartrate (LOPRESSOR) 25 MG tablet Take 1 tablet by mouth twice daily 07/16/20  Yes Duanne Limerick, MD  cephALEXin (KEFLEX) 500 MG capsule Take 1 capsule (500 mg total) by mouth 2 (two) times daily. 06/26/20   Tommie Sams, DO    Family History Family History  Problem Relation Age of Onset  . Dementia Mother   . Other Father        gun shot    Social History Social History   Tobacco Use  . Smoking status: Never Smoker  . Smokeless tobacco: Never Used  Vaping Use  . Vaping Use: Never used  Substance Use Topics  . Alcohol use: Never  . Drug use: Never     Allergies   Ciprofloxacin   Review of Systems Review of Systems  HENT:  Left side of head -- laceration, bleeding and swelling with some mild discomfort  Skin: Positive for wound.       Laceration to the left side of her head -- laterally     Physical Exam Triage Vital Signs ED Triage Vitals  Enc Vitals Group     BP 08/23/20 0823 132/85     Pulse Rate 08/23/20 0823 62     Resp 08/23/20 0823 18     Temp 08/23/20 0823 97.7 F (36.5 C)     Temp Source 08/23/20 0823 Oral     SpO2 08/23/20 0823 100 %     Weight 08/23/20 0821 143 lb 15.4 oz (65.3 kg)     Height --      Head Circumference --      Peak Flow --      Pain Score 08/23/20 0820 3     Pain Loc --      Pain Edu? --      Excl. in GC? --    No data found.  Updated Vital Signs BP 132/85 (BP Location: Left Arm)   Pulse 62   Temp 97.7 F (36.5 C) (Oral)   Resp 18   Wt 65.3 kg   SpO2 100%   BMI 25.50 kg/m    Visual Acuity Right Eye Distance:   Left Eye Distance:   Bilateral Distance:    Right Eye Near:   Left Eye Near:    Bilateral Near:     Physical Exam Vitals and nursing note reviewed.  Constitutional:      Comments: Baseline dementia -- confused about time, person, place and situation.  Not able to tell me the correct birthdate  HENT:     Head:     Comments: 3 cm laceration on left side of head -- small amount of bleeding with associated soft tissue swelling Eyes:     Extraocular Movements: Extraocular movements intact.     Conjunctiva/sclera: Conjunctivae normal.     Pupils: Pupils are equal, round, and reactive to light.  Neurological:     General: No focal deficit present.     Mental Status: She is alert. She is disoriented.     Cranial Nerves: No cranial nerve deficit.     Sensory: No sensory deficit.     Comments: Neuro -- at baseline per daughter      UC Treatments / Results  Labs (all labs ordered are listed, but only abnormal results are displayed) Labs Reviewed - No data to display  EKG   Radiology No results found.  Procedures Procedures (including critical care time)  Medications Ordered in UC Medications - No data to display  Initial Impression / Assessment and Plan / UC Course  I have reviewed the triage vital signs and the nursing notes.  Pertinent labs & imaging results that were available during my care of the patient were reviewed by me and considered in my medical decision making (see chart for details).      Final Clinical Impressions(s) / UC Diagnoses   Final diagnoses:  Laceration of scalp without foreign body, initial encounter  Fall in home, initial encounter     Discharge Instructions     CT not available - transfer to [facility] for further evaluation and management    ED Prescriptions    None     PDMP not reviewed this encounter.   Delton See, MD 08/23/20 [phone]

## 2020-08-23 NOTE — Discharge Instructions (Addendum)
Please discontinue the lisinopril.  Also decrease the insulin glargine to 8 units/day instead of 12 units/day.  Please call primary care this afternoon for a follow-up appointment next week to discuss your medication changes.  Your staples will need to be removed in 10 days.

## 2020-08-23 NOTE — ED Notes (Signed)
Pt given turkey sandwich tray 

## 2020-08-23 NOTE — ED Notes (Signed)
UC

## 2020-08-23 NOTE — ED Provider Notes (Signed)
[facility] Emergency Department Provider Note  ____________________________________________  Time seen: Approximately 1:17 PM  I have reviewed the triage vital signs and the nursing notes.   HISTORY  Chief Complaint Fall and Hypoglycemia    HPI Jenna Odom is a 79 y.o. female that presents to the emergency department for evaluation after a fall this morning.  Daughter reports that patient states that she can feels like she is about to fall but "cannot stop it." She did not lose consciousness.  She hit her head on the dresser.  She did not lose consciousness.  Daughter, who was in the next room heard her fall and went right away to help her.  Patient was able to stand and walk around after the fall.  Daughter checked her blood sugar right away, and noted that it was 40.  She fed her eggs and orange juice after. She has a laceration to the side of her head. They went to urgent care prior to the ER and was sent here.  Patient is on Eliquis.    Patient takes insulin glargine, Farxiga and Metformin for her diabeties.  Farxiga and Lisinopril were started in September by PCP. Daughter states that patient has had less energy since starting these two medication.  Daughter states that patient's blood sugar in the morning has not been over 80 since starting medications.  She has reported to primary care that her blood sugar is low in the morning but they have not changed her medications because her A1c has been within the normal range. She is taking the Lisinopril for her kidneys and daughter does not recall patient ever being diagnosed with HTN.   Past Medical History:  Diagnosis Date  . A-fib (HCC)   . Anxiety   . Dementia (HCC)   . Diabetes mellitus without complication (HCC)   . Hypertension     Patient Active Problem List   Diagnosis Date Noted  . Benign essential hypertension 02/18/2020  . Type 2 diabetes mellitus with hyperglycemia, with long-term current use  of insulin (HCC) 02/28/2019  . Mild cognitive impairment 04/06/2018    Past Surgical History:  Procedure Laterality Date  . ABDOMINAL HYSTERECTOMY      Prior to Admission medications   Medication Sig Start Date End Date Taking? Authorizing Provider  Acetaminophen (TYLENOL PO) Take by mouth as needed.    [provider]  apixaban (ELIQUIS) 5 MG TABS tablet Take 1 tablet (5 mg total) by mouth 2 (two) times daily. cardio 06/30/19   Duanne Limerick, MD  busPIRone (BUSPAR) 5 MG tablet One half tablet in the morning and 1 tablet at night. 01/03/20   Duanne Limerick, MD  cephALEXin (KEFLEX) 500 MG capsule Take 1 capsule (500 mg total) by mouth 2 (two) times daily. 06/26/20   Tommie Sams, DO  dapagliflozin propanediol (FARXIGA) 10 MG TABS tablet Take 1 tablet by mouth in the morning. Gwen Pounds 04/23/20 04/23/21  [provider]  diclofenac Sodium (VOLTAREN) 1 % GEL Apply 2 g topically 4 (four) times daily. 04/12/20   Duanne Limerick, MD  Insulin Glargine (BASAGLAR KWIKPEN) 100 UNIT/ML Inject 0.12 mLs (12 Units total) into the skin daily. 05/10/20   Duanne Limerick, MD  lisinopril (ZESTRIL) 2.5 MG tablet Take 1 tablet by mouth once daily 08/12/20   Duanne Limerick, MD  melatonin 3 MG TABS tablet Take 10 mg by mouth at bedtime.     [provider]  metFORMIN (GLUCOPHAGE) 1000 MG  tablet Take 1 tablet (1,000 mg total) by mouth 2 (two) times daily with a meal. 05/10/20   Duanne Limerick, MD  metoprolol tartrate (LOPRESSOR) 25 MG tablet Take 1 tablet by mouth twice daily 07/16/20   Duanne Limerick, MD    Allergies Ciprofloxacin  Family History  Problem Relation Age of Onset  . Dementia Mother   . Other Father        gun shot    Social History Social History   Tobacco Use  . Smoking status: Never Smoker  . Smokeless tobacco: Never Used  Vaping Use  . Vaping Use: Never used  Substance Use Topics  . Alcohol use: Never  . Drug use: Never     Review of Systems   Constitutional: No fever/chills ENT: No upper respiratory complaints. Cardiovascular: No chest pain. Respiratory: No cough. No SOB. Gastrointestinal: No abdominal pain.  No nausea, no vomiting.  Musculoskeletal: Negative for musculoskeletal pain. Skin: Negative for rash, abrasions, ecchymosis. Positive for laceration. Neurological: Negative for headaches, numbness or tingling   ____________________________________________   PHYSICAL EXAM:  VITAL SIGNS: ED Triage Vitals  Enc Vitals Group     BP 08/23/20 1020 107/66     Pulse Rate 08/23/20 1020 64     Resp 08/23/20 1020 15     Temp 08/23/20 1020 97.8 F (36.6 C)     Temp Source 08/23/20 1020 Oral     SpO2 08/23/20 1020 100 %     Weight 08/23/20 0955 143 lb 15.4 oz (65.3 kg)     Height 08/23/20 0955 5\' 3"  (1.6 m)     Head Circumference --      Peak Flow --      Pain Score 08/23/20 0954 0     Pain Loc --      Pain Edu? --      Excl. in GC? --      Constitutional: Alert. Well appearing and in no acute distress. Eyes: Conjunctivae are normal. PERRL. EOMI. Head:Laceration to left scalp. ENT:      Ears:      Nose: No congestion/rhinnorhea.      Mouth/Throat: Mucous membranes are moist.  Neck: No stridor. Cardiovascular: Normal rate, regular rhythm.  Good peripheral circulation. Respiratory: Normal respiratory effort without tachypnea or retractions. Lungs CTAB. Good air entry to the bases with no decreased or absent breath sounds. Gastrointestinal: Bowel sounds 4 quadrants. Soft and nontender to palpation. No guarding or rigidity. No palpable masses. No distention.  Musculoskeletal: Full range of motion to all extremities. No gross deformities appreciated. Neurologic:  Normal speech and language. No gross focal neurologic deficits are appreciated.  Skin:  Skin is warm, dry and intact. No rash noted. Psychiatric: Mood and affect are normal.   ____________________________________________   LABS (all labs ordered are  listed, but only abnormal results are displayed)  Labs Reviewed  CBC WITH DIFFERENTIAL/PLATELET - Abnormal; Notable for the following components:      Result Value   RDW 16.0 (*)    All other components within normal limits  COMPREHENSIVE METABOLIC PANEL - Abnormal; Notable for the following components:   Glucose, Bld 128 (*)    BUN 25 (*)    GFR, Estimated 58 (*)    All other components within normal limits  CBG MONITORING, ED - Abnormal; Notable for the following components:   Glucose-Capillary 118 (*)    All other components within normal limits  CBG MONITORING, ED   ____________________________________________  EKG   ____________________________________________  RADIOLOGY I, Enid Derry, personally viewed and evaluated these images (plain radiographs) as part of my medical decision making, as well as reviewing the written report by the radiologist.  CT Head Wo Contrast  Result Date: 08/23/2020 CLINICAL DATA:  Head trauma, minor. Fall, head injury. Neck trauma. On Eliquis. EXAM: CT HEAD WITHOUT CONTRAST CT CERVICAL SPINE WITHOUT CONTRAST TECHNIQUE: Multidetector CT imaging of the head and cervical spine was performed following the standard protocol without intravenous contrast. Multiplanar CT image reconstructions of the cervical spine were also generated. COMPARISON:  CT head/maxillofacial 05/24/2018. FINDINGS: CT HEAD FINDINGS Brain: Mild-to-moderate cerebral atrophy. Moderate ill-defined hypoattenuation within the cerebral white matter is nonspecific, but compatible with chronic small vessel ischemic disease. There is no acute intracranial hemorrhage. No demarcated cortical infarct. No extra-axial fluid collection. No evidence of intracranial mass. No midline shift. Vascular: No hyperdense vessel.  Atherosclerotic calcifications. Skull: Normal. Negative for fracture or focal lesion. Sinuses/Orbits: Visualized orbits show no acute finding. No significant paranasal sinus disease  at the imaged levels. Other: Left parietal scalp hematoma. CT CERVICAL SPINE FINDINGS Alignment: Straightening of the expected cervical lordosis. No significant spondylolisthesis. Skull base and vertebrae: The basion-dental and atlanto-dental intervals are maintained.No evidence of acute fracture to the cervical spine. Soft tissues and spinal canal: No prevertebral fluid or swelling. No visible canal hematoma. Disc levels: Cervical spondylosis. Most notably at C5-C6 and C6-C7, there is moderate/advanced disc space narrowing with posterior disc osteophytes and uncovertebral hypertrophy. Upper chest: No consolidation within the imaged lung apices. No visible pneumothorax. Other: Subcentimeter left thyroid lobe nodule not meeting consensus criteria for ultrasound follow-up. IMPRESSION: CT head: 1. No evidence of acute intracranial abnormality. 2. Left parietal scalp hematoma. 3. Mild-to-moderate generalized parenchymal atrophy with moderate chronic small vessel ischemic disease. CT cervical spine: 1. No evidence of acute fracture to the cervical spine. 2. Cervical spondylosis as described and greatest at C5-C6 and C6-C7. Electronically Signed   By: Jackey Loge DO   On: 08/23/2020 10:43   CT Cervical Spine Wo Contrast  Result Date: 08/23/2020 CLINICAL DATA:  Head trauma, minor. Fall, head injury. Neck trauma. On Eliquis. EXAM: CT HEAD WITHOUT CONTRAST CT CERVICAL SPINE WITHOUT CONTRAST TECHNIQUE: Multidetector CT imaging of the head and cervical spine was performed following the standard protocol without intravenous contrast. Multiplanar CT image reconstructions of the cervical spine were also generated. COMPARISON:  CT head/maxillofacial 05/24/2018. FINDINGS: CT HEAD FINDINGS Brain: Mild-to-moderate cerebral atrophy. Moderate ill-defined hypoattenuation within the cerebral white matter is nonspecific, but compatible with chronic small vessel ischemic disease. There is no acute intracranial hemorrhage. No  demarcated cortical infarct. No extra-axial fluid collection. No evidence of intracranial mass. No midline shift. Vascular: No hyperdense vessel.  Atherosclerotic calcifications. Skull: Normal. Negative for fracture or focal lesion. Sinuses/Orbits: Visualized orbits show no acute finding. No significant paranasal sinus disease at the imaged levels. Other: Left parietal scalp hematoma. CT CERVICAL SPINE FINDINGS Alignment: Straightening of the expected cervical lordosis. No significant spondylolisthesis. Skull base and vertebrae: The basion-dental and atlanto-dental intervals are maintained.No evidence of acute fracture to the cervical spine. Soft tissues and spinal canal: No prevertebral fluid or swelling. No visible canal hematoma. Disc levels: Cervical spondylosis. Most notably at C5-C6 and C6-C7, there is moderate/advanced disc space narrowing with posterior disc osteophytes and uncovertebral hypertrophy. Upper chest: No consolidation within the imaged lung apices. No visible pneumothorax. Other: Subcentimeter left thyroid lobe nodule not meeting consensus criteria for ultrasound follow-up. IMPRESSION: CT head: 1. No evidence of acute intracranial abnormality.  2. Left parietal scalp hematoma. 3. Mild-to-moderate generalized parenchymal atrophy with moderate chronic small vessel ischemic disease. CT cervical spine: 1. No evidence of acute fracture to the cervical spine. 2. Cervical spondylosis as described and greatest at C5-C6 and C6-C7. Electronically Signed   By: Jackey Loge DO   On: 08/23/2020 10:43    ____________________________________________    PROCEDURES  Procedure(s) performed:    Procedures  LACERATION REPAIR Performed by: Enid Derry  Consent: Verbal consent obtained.  Consent given by: patient  Prepped and Draped in normal sterile fashion  Wound explored: No foreign bodies   Laceration Location: head  Laceration Length: 2 cm  Anesthesia: None  Local anesthetic:  lidocaine 1% without epinephrine  Anesthetic total: 2 ml  Irrigation method: syringe  Amount of cleaning: normal saline  Skin closure: Staples  Number of staples: 5  Patient tolerance: Patient tolerated the procedure well with no immediate complications.  Medications  lactated ringers bolus 1,000 mL (has no administration in time range)  Tdap (BOOSTRIX) injection 0.5 mL (has no administration in time range)  lidocaine-EPINEPHrine-tetracaine (LET) topical gel (has no administration in time range)  lidocaine (PF) (XYLOCAINE) 1 % injection 5 mL (has no administration in time range)     ____________________________________________   INITIAL IMPRESSION / ASSESSMENT AND PLAN / ED COURSE  Pertinent labs & imaging results that were available during my care of the patient were reviewed by me and considered in my medical decision making (see chart for details).  Review of the [place] CSRS was performed in accordance of the [facility] prior to dispensing any controlled drugs.     Patient presented to emergency department for evaluation after a fall this morning.  Exam is reassuring.  Daughter states that patient's blood sugar just after the fall was 40.  Daughter reports that her blood sugar is regularly low in the morning.  She is taking 12 units of insulin glargine at night.  Daughter will decrease her insulin to 8 units/day.  Patient's blood pressure was also low in the emergency department.  She will discontinue the lisinopril until she follows up with primary care.  Her lab work is largely unremarkable.  Her CT scans are negative for acute intercranial abnormality or fracture.  Laceration was repaired with staples.  Patient appears overall very well currently.  She is eating a lunch tray and talkative.  Dr. Katrinka Blazing has also been to evaluate the patient and agrees with plan of care to discontinue lisinopril and decrease insulin and outpatient follow-up.  Patient is to follow up with primary care  as directed.  Daughter will be taking patient home.  Patient is given ED precautions to return to the ED for any worsening or new symptoms.   Jenna Odom was evaluated in Emergency Department on 08/23/2020 for the symptoms described in the history of present illness. She was evaluated in the context of the global COVID-19 pandemic, which necessitated consideration that the patient might be at risk for infection with the SARS-CoV-2 virus that causes COVID-19. Institutional protocols and algorithms that pertain to the evaluation of patients at risk for COVID-19 are in a state of rapid change based on information released by regulatory bodies including the CDC and federal and state organizations. These policies and algorithms were followed during the patient's care in the ED. ____________________________________________  FINAL CLINICAL IMPRESSION(S) / ED DIAGNOSES  Fall Hypotension Hypoglycemia Head laceration  NEW MEDICATIONS STARTED DURING THIS VISIT:  ED Discharge Orders    None  This chart was dictated using voice recognition software/Dragon. Despite best efforts to proofread, errors can occur which can change the meaning. Any change was purely unintentional.    Enid DerryWagner, Lulla Linville, PA-C 08/24/20 1607    Delton PrairieSmith, Dylan, MD 08/25/20 [phone]

## 2020-08-23 NOTE — ED Triage Notes (Signed)
Pt to ED via [facility] from [facility] with c/o fall at approx 0600, per daughter pt hypoglycemic at time of fall with CBG 40. Pt with laceration to posterior head.     CBG 128 VSS

## 2020-08-23 NOTE — ED Triage Notes (Signed)
Pt with hx of dementia at baseline, denies pain at this time. Pt alert, small laceration noted to L side of head at this time, bleeding controlled at this time.

## 2020-08-25 ENCOUNTER — Other Ambulatory Visit: Payer: Self-pay | Admitting: Family Medicine

## 2020-08-25 DIAGNOSIS — F419 Anxiety disorder, unspecified: Secondary | ICD-10-CM

## 2020-08-25 DIAGNOSIS — G301 Alzheimer's disease with late onset: Secondary | ICD-10-CM

## 2020-08-25 NOTE — Telephone Encounter (Signed)
Requested Prescriptions  Pending Prescriptions Disp Refills  . busPIRone (BUSPAR) 5 MG tablet [Pharmacy Med Name: busPIRone HCl 5 MG Oral Tablet] 135 tablet 0    Sig: TAKE 1/2 (ONE-HALF) TABLET BY MOUTH IN THE MORNING AND 1 AT NIGHT     Psychiatry: Anxiolytics/Hypnotics - Non-controlled Passed - 08/25/2020 11:08 AM      Passed - Valid encounter within last 6 months    Recent Outpatient Visits          3 months ago Type 2 diabetes mellitus without complication, with long-term current use of insulin (HCC)   [facility] Duanne Limerick, MD   4 months ago Arthritis of right knee   [facility] Duanne Limerick, MD   7 months ago Type 2 diabetes mellitus without complication, with long-term current use of insulin (HCC)   [facility] Duanne Limerick, MD   1 year ago Type 2 diabetes mellitus without complication, with long-term current use of insulin (HCC)   [facility] Duanne Limerick, MD   1 year ago Urethritis   [facility] Duanne Limerick, MD      Future Appointments            In 1 week Duanne Limerick, MD [facility]

## 2020-08-28 ENCOUNTER — Ambulatory Visit: Payer: Medicare HMO | Admitting: Family Medicine

## 2020-09-04 ENCOUNTER — Other Ambulatory Visit: Payer: Self-pay

## 2020-09-04 ENCOUNTER — Ambulatory Visit (INDEPENDENT_AMBULATORY_CARE_PROVIDER_SITE_OTHER): Payer: Medicare HMO | Admitting: Family Medicine

## 2020-09-04 ENCOUNTER — Encounter: Payer: Self-pay | Admitting: Family Medicine

## 2020-09-04 ENCOUNTER — Other Ambulatory Visit
Admission: RE | Admit: 2020-09-04 | Discharge: 2020-09-04 | Disposition: A | Discharge status: Home / Self Care | Payer: Medicare HMO | Attending: Family Medicine | Admitting: Family Medicine

## 2020-09-04 ENCOUNTER — Encounter: Payer: Self-pay | Admitting: Emergency Medicine

## 2020-09-04 ENCOUNTER — Ambulatory Visit
Admission: EM | Admit: 2020-09-04 | Discharge: 2020-09-04 | Disposition: A | Discharge status: Home / Self Care | Payer: Medicare HMO | Attending: Family Medicine | Admitting: Family Medicine

## 2020-09-04 VITALS — BP 130/88 | HR 80 | Ht 63.0 in | Wt 143.0 lb

## 2020-09-04 DIAGNOSIS — E119 Type 2 diabetes mellitus without complications: Secondary | ICD-10-CM | POA: Diagnosis present

## 2020-09-04 DIAGNOSIS — Z794 Long term (current) use of insulin: Secondary | ICD-10-CM

## 2020-09-04 DIAGNOSIS — E1165 Type 2 diabetes mellitus with hyperglycemia: Secondary | ICD-10-CM

## 2020-09-04 DIAGNOSIS — Z4802 Encounter for removal of sutures: Secondary | ICD-10-CM

## 2020-09-04 LAB — GLUCOSE, RANDOM: Glucose, Bld: 148 mg/dL — ABNORMAL HIGH (ref 70–99)

## 2020-09-04 NOTE — ED Triage Notes (Signed)
Patient here for staple removal to the left side of her head. These were placed on 12/17 at [facility].

## 2020-09-04 NOTE — ED Provider Notes (Signed)
[facility]    CSN: [id number] Arrival date & time: 09/04/20  0843      History   Chief Complaint Chief Complaint  Patient presents with   Suture / Staple Removal   HPI  79 year old female presents for staple removal.  Patient had scalp laceration which was repaired in the ER on 12/17.  She has 5 staples to the left temporal region.  Wound is well-healed.  No drainage.  She is in need of removal today.  Past Medical History:  Diagnosis Date   A-fib (HCC)    Anxiety    Dementia (HCC)    Diabetes mellitus without complication (HCC)    Hypertension     Patient Active Problem List   Diagnosis Date Noted   Benign essential hypertension 02/18/2020   Type 2 diabetes mellitus with hyperglycemia, with long-term current use of insulin (HCC) 02/28/2019   Mild cognitive impairment 04/06/2018    Past Surgical History:  Procedure Laterality Date   ABDOMINAL HYSTERECTOMY      OB History   No obstetric history on file.      Home Medications    Prior to Admission medications   Medication Sig Start Date End Date Taking? Authorizing Provider  Acetaminophen (TYLENOL PO) Take by mouth as needed.   Yes [provider]  apixaban (ELIQUIS) 5 MG TABS tablet Take 1 tablet (5 mg total) by mouth 2 (two) times daily. cardio 06/30/19  Yes Duanne Limerick, MD  busPIRone (BUSPAR) 5 MG tablet TAKE 1/2 (ONE-HALF) TABLET BY MOUTH IN THE MORNING AND 1 AT NIGHT 08/25/20  Yes Duanne Limerick, MD  dapagliflozin propanediol (FARXIGA) 10 MG TABS tablet Take 1 tablet by mouth in the morning. Gwen Pounds 04/23/20 04/23/21 Yes [provider]  diclofenac Sodium (VOLTAREN) 1 % GEL Apply 2 g topically 4 (four) times daily. 04/12/20  Yes Duanne Limerick, MD  Insulin Glargine (BASAGLAR KWIKPEN) 100 UNIT/ML Inject 0.12 mLs (12 Units total) into the skin daily. Patient taking differently: Inject 8 Units into the skin daily. 05/10/20  Yes Duanne Limerick, MD  lisinopril  (ZESTRIL) 2.5 MG tablet Take 1 tablet by mouth once daily 08/12/20  Yes Duanne Limerick, MD  melatonin 3 MG TABS tablet Take 10 mg by mouth at bedtime.    Yes [provider]  metFORMIN (GLUCOPHAGE) 1000 MG tablet Take 1 tablet (1,000 mg total) by mouth 2 (two) times daily with a meal. 05/10/20  Yes Duanne Limerick, MD  metoprolol tartrate (LOPRESSOR) 25 MG tablet Take 1 tablet by mouth twice daily 07/16/20  Yes Duanne Limerick, MD    Family History Family History  Problem Relation Age of Onset   Dementia Mother    Other Father        gun shot    Social History Social History   Tobacco Use   Smoking status: Never Smoker   Smokeless tobacco: Never Used  Vaping Use   Vaping Use: Never used  Substance Use Topics   Alcohol use: Never   Drug use: Never     Allergies   Ciprofloxacin   Review of Systems Review of Systems  Constitutional: Negative.   Skin: Positive for wound.   Physical Exam Triage Vital Signs ED Triage Vitals  Enc Vitals Group     BP 09/04/20 1002 118/79     Pulse Rate 09/04/20 1002 85     Resp 09/04/20 1002 18     Temp 09/04/20 1002 98 F (36.7 C)  Temp Source 09/04/20 1002 Oral     SpO2 09/04/20 1002 100 %     Weight 09/04/20 1001 143 lb 1.3 oz (64.9 kg)     Height 09/04/20 1001 5\' 3"  (1.6 m)     Head Circumference --      Peak Flow --      Pain Score 09/04/20 1000 0     Pain Loc --      Pain Edu? --      Excl. in GC? --    No data found.  Updated Vital Signs BP 118/79 (BP Location: Right Arm)    Pulse 85    Temp 98 F (36.7 C) (Oral)    Resp 18    Ht 5\' 3"  (1.6 m)    Wt 64.9 kg    SpO2 100%    BMI 25.35 kg/m   Visual Acuity Right Eye Distance:   Left Eye Distance:   Bilateral Distance:    Right Eye Near:   Left Eye Near:    Bilateral Near:     Physical Exam Vitals and nursing note reviewed.  Constitutional:      General: She is not in acute distress.    Appearance: Normal appearance. She is not ill-appearing.   HENT:     Head:      Comments: Wound is well-healed.  Eschar noted.  No drainage. Eyes:     General:        Right eye: No discharge.        Left eye: No discharge.     Conjunctiva/sclera: Conjunctivae normal.  Pulmonary:     Effort: Pulmonary effort is normal. No respiratory distress.  Neurological:     General: No focal deficit present.     Mental Status: She is alert.  Psychiatric:        Mood and Affect: Mood normal.        Behavior: Behavior normal.      UC Treatments / Results  Labs (all labs ordered are listed, but only abnormal results are displayed) Labs Reviewed - No data to display  EKG   Radiology No results found.  Procedures Procedures (including critical care time) 5 staples removed today in standard fashion without difficulty.  Medications Ordered in UC Medications - No data to display  Initial Impression / Assessment and Plan / UC Course  I have reviewed the triage vital signs and the nursing notes.  Pertinent labs & imaging results that were available during my care of the patient were reviewed by me and considered in my medical decision making (see chart for details).    79 year old female presents for staple removal.  Removed without difficulty today.  Supportive care.  Final Clinical Impressions(s) / UC Diagnoses   Final diagnoses:  Encounter for staple removal   Discharge Instructions   None    ED Prescriptions    None     PDMP not reviewed this encounter.   , 76 09/04/20 1040

## 2020-09-04 NOTE — Progress Notes (Signed)
Patient presents today for recheck of her diabetes and hypertension.  She was also expecting to have staples removed from her recent laceration but we do not have the means of removing staples in our clinic.                                                                                                                The patient's daughter is dissatisfied with the level of care that the patient has been receiving and that she is overmedicated.  This is despite most recent visit with Dr. Cherylann Ratel which suggested that he wanted her A1c under 7 at which it was noted that the last A1c was 6.5 on 05/10/2020.  Following her emergency room evaluation decision was made to  decreased her glargine to 8 units from 12  Units.  It was also decided to discontinue her lisinopril until she was rechecked by me.  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  It was noted that her blood pressure was normal at the urgent care but only low normal when she went to the emergency room.  She is also currently taking Metformin and glargine insulin for her diabetes.  Blood pressure today is 130/88 and review of Dr. 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  Patient's daughter is in disagreement with this medication as well and her mother has not been receiving the lisinopril at this time.  Patient is also on Farxiga but this was started by her cardiologist mostly for cardiac reasons.                                                                                          Patient's daughter is not happy with the care her mother has been receiving from me even though her A1c levels and blood pressure has been in normal range at the clinic.  nephrology appointment has concurred with the current level of treatment of diabetic control and hypertension.  It was noted that  the patient's Marcelline Deist was started by her cardiologist Dr. Gwen Pounds on 04/23/2020 and I have only dealt with her glargine and Metformin in the past.  On review of her cardiology note it was explained to the patient's daughter that it was important to have hypertension control at the level that she needed to be at as well as the initiation of Farxiga to reduce congestive heart failure concerns.  The daughter's concern is been particularly placed upon her being overmedicated even though it has been explained that these medications have been necessary in the past and only recently did she have a fall with a blood sugar of 40.  Patient's daughter stated that she is not happy with her care with myself and that she has already established future care with [facility].  She plans to have all her physicians cardiology, nephrology, endocrinology, and primary care to be conducted at this facility.  I have stated that I will be glad to check her current A1c and glucose as that this will be of interest of her new patient initial evaluation.  I have stated that I will be glad to continue surveillance care the next 30 days if she should desire to medical care during this time. Patient was sent to the urgent care for staple removal and given prescriptions to check the above labs.

## 2020-09-05 LAB — HEMOGLOBIN A1C
Hgb A1c MFr Bld: 5.8 % — ABNORMAL HIGH (ref 4.8–5.6)
Mean Plasma Glucose: 119.76 mg/dL

## 2020-09-19 ENCOUNTER — Other Ambulatory Visit: Payer: Self-pay | Admitting: Family Medicine

## 2020-09-19 DIAGNOSIS — Z1231 Encounter for screening mammogram for malignant neoplasm of breast: Secondary | ICD-10-CM

## 2021-06-02 ENCOUNTER — Ambulatory Visit: Payer: Medicare HMO

## 2021-09-11 IMAGING — CT CT HEAD W/O CM
3 series · 14 of 45 positions shown, 16 images · non-contrast
Comparison: CT head/maxillofacial 05/24/2018.

CLINICAL DATA: Head trauma, minor. Fall, head injury. Neck trauma.
On Eliquis.

EXAM:
CT HEAD WITHOUT CONTRAST
CT CERVICAL SPINE WITHOUT CONTRAST
TECHNIQUE: Multidetector CT imaging of the head and cervical spine was
performed following the standard protocol without intravenous
contrast. Multiplanar CT image reconstructions of the cervical spine
were also generated.

[Series 2: head wo · axial · 0.44mm/px · z∈[+842,+957]mm · 8 of 28 slices shown, 10 images]
[im 3/28  brain]
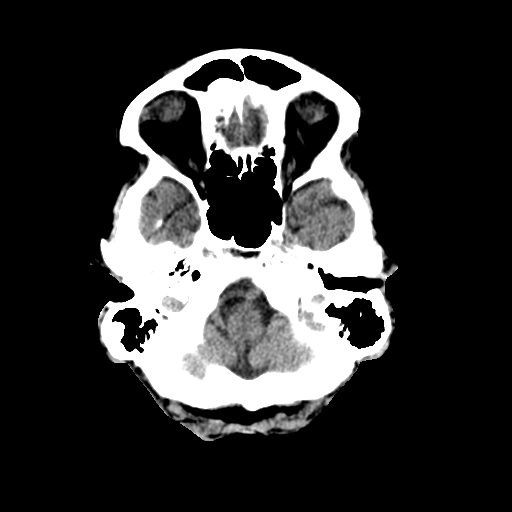
[im 3/28  bone]
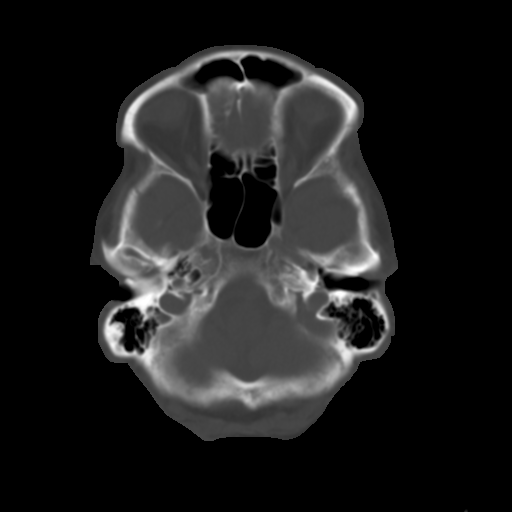
[im 6/28  brain]
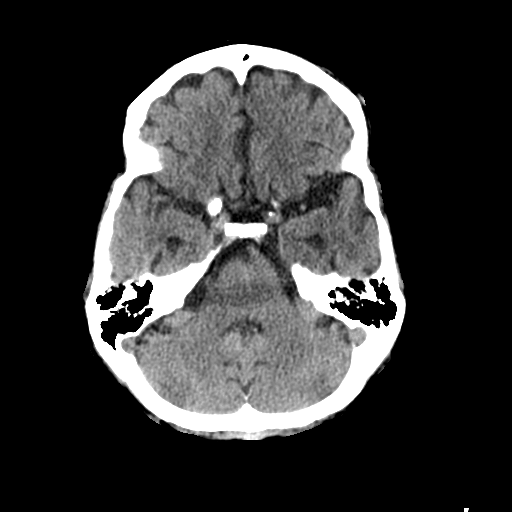
[im 10/28  brain]
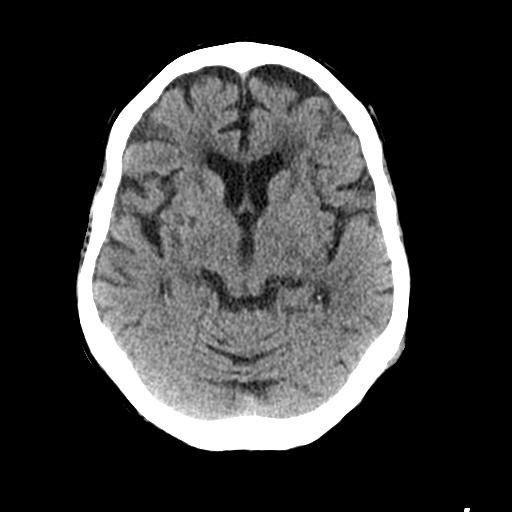
[im 13/28  brain]
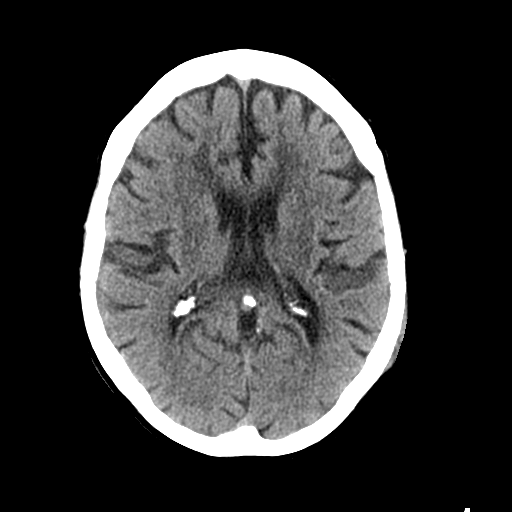
[im 16/28  brain]
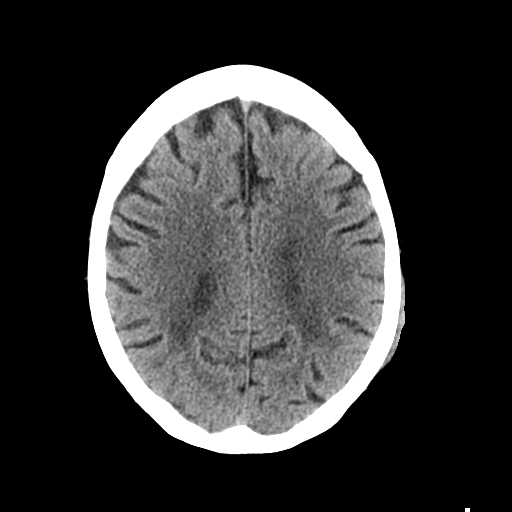
[im 16/28  bone]
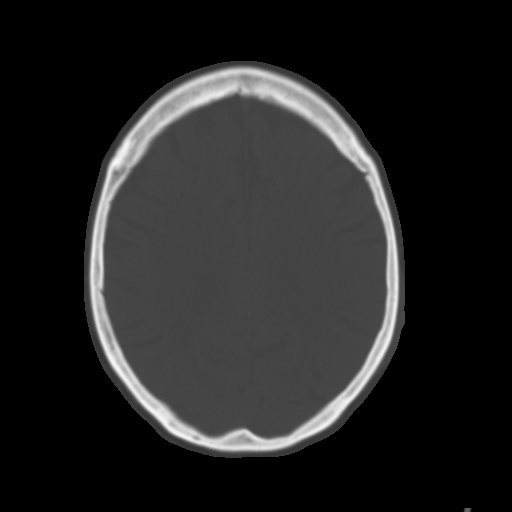
[im 19/28  brain]
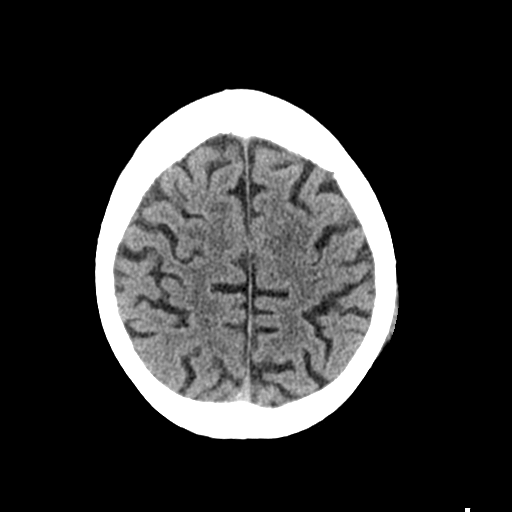
[im 23/28  brain]
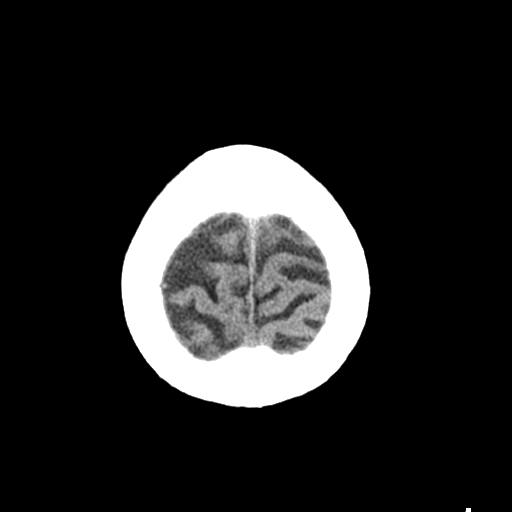
[im 26/28  brain]
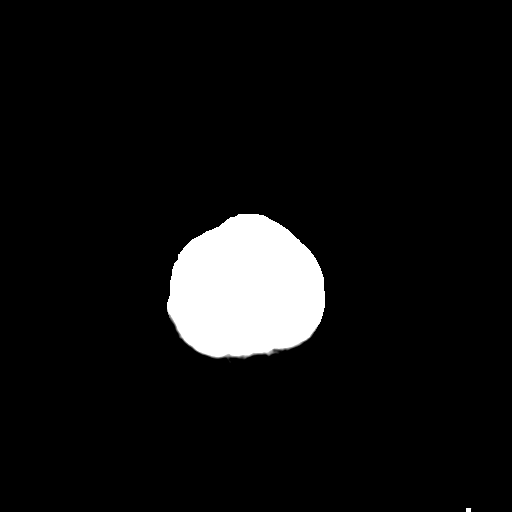

[Series 4: coronal soft tissue · coronal · 0.27mm/px · 3 of 65 slices shown]
[im 22/65  brain]
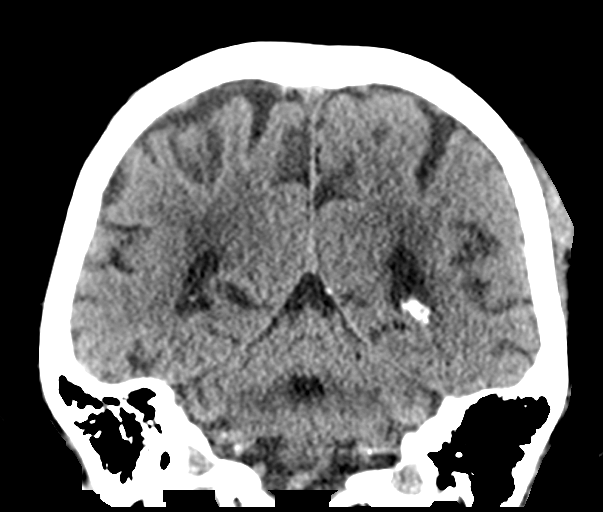
[im 29/65  brain]
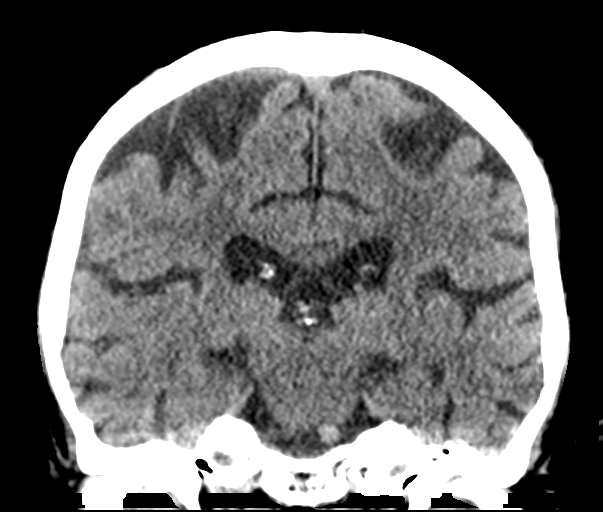
[im 36/65  brain]
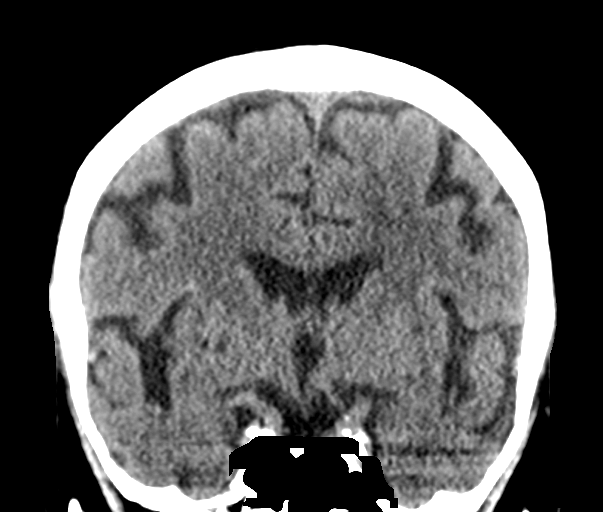

[Series 5: sagittal soft tissue · sagittal · 0.27mm/px · 3 of 50 slices shown]
[im 17/50  brain]
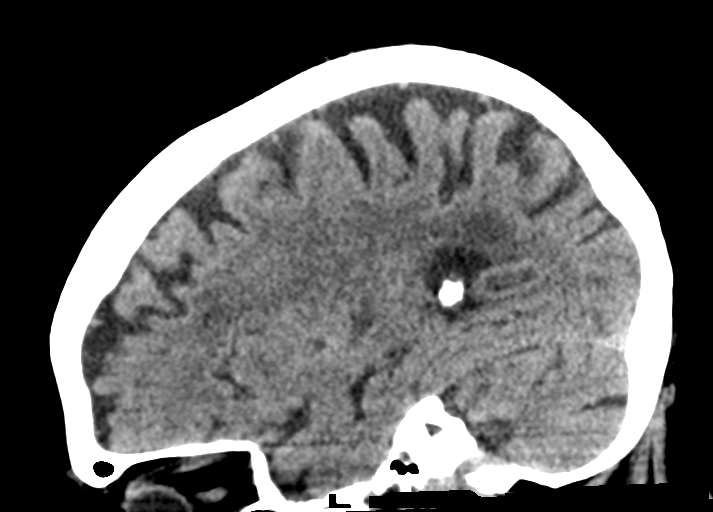
[im 25/50  brain]
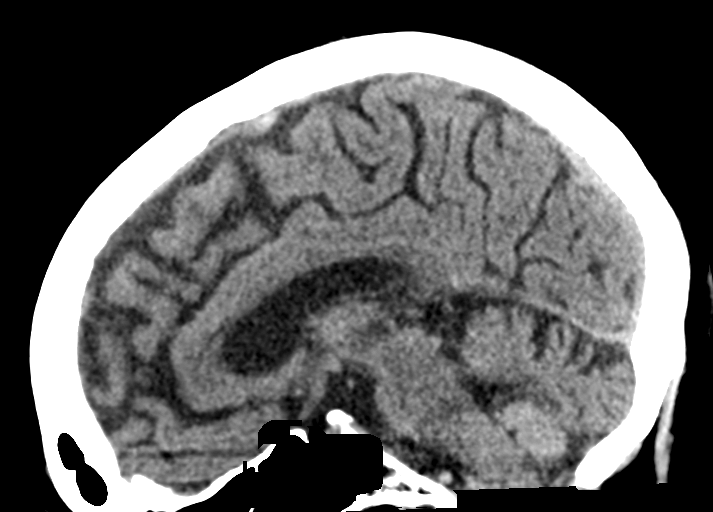
[im 33/50  brain]
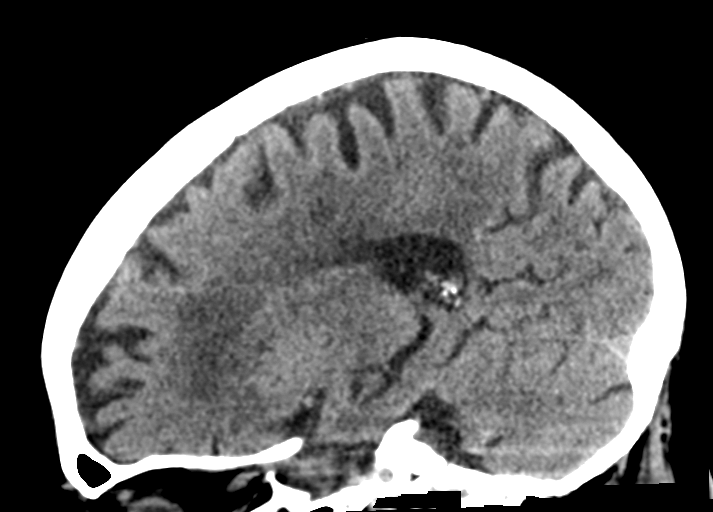

[14 of 45 positions shown; findings below may reference images not displayed]

FINDINGS: CT HEAD FINDINGS

Brain:

Mild-to-moderate cerebral atrophy.

Moderate ill-defined hypoattenuation within the cerebral white
matter is nonspecific, but compatible with chronic small vessel
ischemic disease.

There is no acute intracranial hemorrhage.

No demarcated cortical infarct.

No extra-axial fluid collection.

No evidence of intracranial mass.

No midline shift.

Vascular: No hyperdense vessel.  Atherosclerotic calcifications.

Skull: Normal. Negative for fracture or focal lesion.

Sinuses/Orbits: Visualized orbits show no acute finding. No
significant paranasal sinus disease at the imaged levels.

Other: Left parietal scalp hematoma.

CT CERVICAL SPINE FINDINGS

Alignment: Straightening of the expected cervical lordosis. No
significant spondylolisthesis.

Skull base and vertebrae: The basion-dental and atlanto-dental
intervals are maintained.No evidence of acute fracture to the
cervical spine.

Soft tissues and spinal canal: No prevertebral fluid or swelling. No
visible canal hematoma.

Disc levels: Cervical spondylosis. Most notably at C5-C6 and C6-C7,
there is moderate/advanced disc space narrowing with posterior disc
osteophytes and uncovertebral hypertrophy.

Upper chest: No consolidation within the imaged lung apices. No
visible pneumothorax.

Other: Subcentimeter left thyroid lobe nodule not meeting consensus
criteria for ultrasound follow-up.
IMPRESSION: CT head:

1. No evidence of acute intracranial abnormality.
2. Left parietal scalp hematoma.
3. Mild-to-moderate generalized parenchymal atrophy with moderate
chronic small vessel ischemic disease.

CT cervical spine:

1. No evidence of acute fracture to the cervical spine.
2. Cervical spondylosis as described and greatest at C5-C6 and
C6-C7.

## 2022-09-14 DIAGNOSIS — E038 Other specified hypothyroidism: Secondary | ICD-10-CM | POA: Diagnosis not present

## 2022-09-14 DIAGNOSIS — E782 Mixed hyperlipidemia: Secondary | ICD-10-CM | POA: Diagnosis not present

## 2022-09-14 DIAGNOSIS — E119 Type 2 diabetes mellitus without complications: Secondary | ICD-10-CM | POA: Diagnosis not present

## 2022-09-14 DIAGNOSIS — Z79899 Other long term (current) drug therapy: Secondary | ICD-10-CM | POA: Diagnosis not present

## 2022-09-14 DIAGNOSIS — D518 Other vitamin B12 deficiency anemias: Secondary | ICD-10-CM | POA: Diagnosis not present

## 2022-09-14 DIAGNOSIS — N3080 Other cystitis without hematuria: Secondary | ICD-10-CM | POA: Diagnosis not present

## 2022-09-17 DIAGNOSIS — I4891 Unspecified atrial fibrillation: Secondary | ICD-10-CM | POA: Diagnosis not present

## 2022-09-17 DIAGNOSIS — R5381 Other malaise: Secondary | ICD-10-CM | POA: Diagnosis not present

## 2022-09-17 DIAGNOSIS — G8929 Other chronic pain: Secondary | ICD-10-CM | POA: Diagnosis not present

## 2022-09-17 DIAGNOSIS — E538 Deficiency of other specified B group vitamins: Secondary | ICD-10-CM | POA: Diagnosis not present

## 2022-09-17 DIAGNOSIS — N189 Chronic kidney disease, unspecified: Secondary | ICD-10-CM | POA: Diagnosis not present

## 2022-09-17 DIAGNOSIS — N390 Urinary tract infection, site not specified: Secondary | ICD-10-CM | POA: Diagnosis not present

## 2022-09-17 DIAGNOSIS — I10 Essential (primary) hypertension: Secondary | ICD-10-CM | POA: Diagnosis not present

## 2022-09-17 DIAGNOSIS — G4700 Insomnia, unspecified: Secondary | ICD-10-CM | POA: Diagnosis not present

## 2022-09-17 DIAGNOSIS — E119 Type 2 diabetes mellitus without complications: Secondary | ICD-10-CM | POA: Diagnosis not present

## 2022-09-23 DIAGNOSIS — F5105 Insomnia due to other mental disorder: Secondary | ICD-10-CM | POA: Diagnosis not present

## 2022-09-23 DIAGNOSIS — F03C18 Unspecified dementia, severe, with other behavioral disturbance: Secondary | ICD-10-CM | POA: Diagnosis not present

## 2022-09-23 DIAGNOSIS — F03C11 Unspecified dementia, severe, with agitation: Secondary | ICD-10-CM | POA: Diagnosis not present

## 2022-09-29 DIAGNOSIS — E538 Deficiency of other specified B group vitamins: Secondary | ICD-10-CM | POA: Diagnosis not present

## 2022-09-29 DIAGNOSIS — G8929 Other chronic pain: Secondary | ICD-10-CM | POA: Diagnosis not present

## 2022-09-29 DIAGNOSIS — I4891 Unspecified atrial fibrillation: Secondary | ICD-10-CM | POA: Diagnosis not present

## 2022-09-29 DIAGNOSIS — E119 Type 2 diabetes mellitus without complications: Secondary | ICD-10-CM | POA: Diagnosis not present

## 2022-09-29 DIAGNOSIS — R5381 Other malaise: Secondary | ICD-10-CM | POA: Diagnosis not present

## 2022-09-29 DIAGNOSIS — I10 Essential (primary) hypertension: Secondary | ICD-10-CM | POA: Diagnosis not present

## 2022-09-29 DIAGNOSIS — N189 Chronic kidney disease, unspecified: Secondary | ICD-10-CM | POA: Diagnosis not present

## 2022-09-29 DIAGNOSIS — E559 Vitamin D deficiency, unspecified: Secondary | ICD-10-CM | POA: Diagnosis not present

## 2022-09-29 DIAGNOSIS — G4700 Insomnia, unspecified: Secondary | ICD-10-CM | POA: Diagnosis not present

## 2022-10-03 DIAGNOSIS — E559 Vitamin D deficiency, unspecified: Secondary | ICD-10-CM | POA: Diagnosis not present

## 2022-10-03 DIAGNOSIS — I4891 Unspecified atrial fibrillation: Secondary | ICD-10-CM | POA: Diagnosis not present

## 2022-10-03 DIAGNOSIS — E038 Other specified hypothyroidism: Secondary | ICD-10-CM | POA: Diagnosis not present

## 2022-10-03 DIAGNOSIS — E782 Mixed hyperlipidemia: Secondary | ICD-10-CM | POA: Diagnosis not present

## 2022-10-03 DIAGNOSIS — I10 Essential (primary) hypertension: Secondary | ICD-10-CM | POA: Diagnosis not present

## 2022-10-03 DIAGNOSIS — N189 Chronic kidney disease, unspecified: Secondary | ICD-10-CM | POA: Diagnosis not present

## 2022-10-03 DIAGNOSIS — D518 Other vitamin B12 deficiency anemias: Secondary | ICD-10-CM | POA: Diagnosis not present

## 2022-10-03 DIAGNOSIS — E119 Type 2 diabetes mellitus without complications: Secondary | ICD-10-CM | POA: Diagnosis not present

## 2022-10-03 DIAGNOSIS — G309 Alzheimer's disease, unspecified: Secondary | ICD-10-CM | POA: Diagnosis not present

## 2022-10-04 DIAGNOSIS — E1165 Type 2 diabetes mellitus with hyperglycemia: Secondary | ICD-10-CM | POA: Diagnosis not present

## 2022-10-05 DIAGNOSIS — Z79899 Other long term (current) drug therapy: Secondary | ICD-10-CM | POA: Diagnosis not present

## 2022-10-21 DIAGNOSIS — F03C18 Unspecified dementia, severe, with other behavioral disturbance: Secondary | ICD-10-CM | POA: Diagnosis not present

## 2022-10-21 DIAGNOSIS — F5105 Insomnia due to other mental disorder: Secondary | ICD-10-CM | POA: Diagnosis not present

## 2022-10-21 DIAGNOSIS — F03C11 Unspecified dementia, severe, with agitation: Secondary | ICD-10-CM | POA: Diagnosis not present

## 2022-10-27 DIAGNOSIS — I4891 Unspecified atrial fibrillation: Secondary | ICD-10-CM | POA: Diagnosis not present

## 2022-10-27 DIAGNOSIS — N189 Chronic kidney disease, unspecified: Secondary | ICD-10-CM | POA: Diagnosis not present

## 2022-10-27 DIAGNOSIS — E119 Type 2 diabetes mellitus without complications: Secondary | ICD-10-CM | POA: Diagnosis not present

## 2022-10-27 DIAGNOSIS — I10 Essential (primary) hypertension: Secondary | ICD-10-CM | POA: Diagnosis not present

## 2022-10-27 DIAGNOSIS — R5381 Other malaise: Secondary | ICD-10-CM | POA: Diagnosis not present

## 2022-10-27 DIAGNOSIS — G8929 Other chronic pain: Secondary | ICD-10-CM | POA: Diagnosis not present

## 2022-10-27 DIAGNOSIS — E538 Deficiency of other specified B group vitamins: Secondary | ICD-10-CM | POA: Diagnosis not present

## 2022-10-27 DIAGNOSIS — E559 Vitamin D deficiency, unspecified: Secondary | ICD-10-CM | POA: Diagnosis not present

## 2022-10-27 DIAGNOSIS — G4700 Insomnia, unspecified: Secondary | ICD-10-CM | POA: Diagnosis not present

## 2022-10-30 DIAGNOSIS — I4891 Unspecified atrial fibrillation: Secondary | ICD-10-CM | POA: Diagnosis not present

## 2022-10-30 DIAGNOSIS — E038 Other specified hypothyroidism: Secondary | ICD-10-CM | POA: Diagnosis not present

## 2022-10-30 DIAGNOSIS — D518 Other vitamin B12 deficiency anemias: Secondary | ICD-10-CM | POA: Diagnosis not present

## 2022-10-30 DIAGNOSIS — E782 Mixed hyperlipidemia: Secondary | ICD-10-CM | POA: Diagnosis not present

## 2022-10-30 DIAGNOSIS — E559 Vitamin D deficiency, unspecified: Secondary | ICD-10-CM | POA: Diagnosis not present

## 2022-10-30 DIAGNOSIS — G309 Alzheimer's disease, unspecified: Secondary | ICD-10-CM | POA: Diagnosis not present

## 2022-10-30 DIAGNOSIS — E119 Type 2 diabetes mellitus without complications: Secondary | ICD-10-CM | POA: Diagnosis not present

## 2022-10-30 DIAGNOSIS — I10 Essential (primary) hypertension: Secondary | ICD-10-CM | POA: Diagnosis not present

## 2022-10-30 DIAGNOSIS — N189 Chronic kidney disease, unspecified: Secondary | ICD-10-CM | POA: Diagnosis not present

## 2022-11-04 DIAGNOSIS — E1165 Type 2 diabetes mellitus with hyperglycemia: Secondary | ICD-10-CM | POA: Diagnosis not present

## 2022-11-11 DIAGNOSIS — F5105 Insomnia due to other mental disorder: Secondary | ICD-10-CM | POA: Diagnosis not present

## 2022-11-11 DIAGNOSIS — F03C11 Unspecified dementia, severe, with agitation: Secondary | ICD-10-CM | POA: Diagnosis not present

## 2022-11-11 DIAGNOSIS — F03C18 Unspecified dementia, severe, with other behavioral disturbance: Secondary | ICD-10-CM | POA: Diagnosis not present

## 2022-11-16 DIAGNOSIS — B351 Tinea unguium: Secondary | ICD-10-CM | POA: Diagnosis not present

## 2022-11-16 DIAGNOSIS — M79671 Pain in right foot: Secondary | ICD-10-CM | POA: Diagnosis not present

## 2022-11-16 DIAGNOSIS — L600 Ingrowing nail: Secondary | ICD-10-CM | POA: Diagnosis not present

## 2022-11-16 DIAGNOSIS — M79672 Pain in left foot: Secondary | ICD-10-CM | POA: Diagnosis not present

## 2022-11-16 DIAGNOSIS — E1140 Type 2 diabetes mellitus with diabetic neuropathy, unspecified: Secondary | ICD-10-CM | POA: Diagnosis not present

## 2022-11-17 DIAGNOSIS — F03C18 Unspecified dementia, severe, with other behavioral disturbance: Secondary | ICD-10-CM | POA: Diagnosis not present

## 2022-11-17 DIAGNOSIS — F03C11 Unspecified dementia, severe, with agitation: Secondary | ICD-10-CM | POA: Diagnosis not present

## 2022-11-17 DIAGNOSIS — F5105 Insomnia due to other mental disorder: Secondary | ICD-10-CM | POA: Diagnosis not present

## 2022-11-24 DIAGNOSIS — N189 Chronic kidney disease, unspecified: Secondary | ICD-10-CM | POA: Diagnosis not present

## 2022-11-24 DIAGNOSIS — E559 Vitamin D deficiency, unspecified: Secondary | ICD-10-CM | POA: Diagnosis not present

## 2022-11-24 DIAGNOSIS — D518 Other vitamin B12 deficiency anemias: Secondary | ICD-10-CM | POA: Diagnosis not present

## 2022-11-24 DIAGNOSIS — G8929 Other chronic pain: Secondary | ICD-10-CM | POA: Diagnosis not present

## 2022-11-24 DIAGNOSIS — G309 Alzheimer's disease, unspecified: Secondary | ICD-10-CM | POA: Diagnosis not present

## 2022-11-24 DIAGNOSIS — G4700 Insomnia, unspecified: Secondary | ICD-10-CM | POA: Diagnosis not present

## 2022-11-24 DIAGNOSIS — E7849 Other hyperlipidemia: Secondary | ICD-10-CM | POA: Diagnosis not present

## 2022-11-24 DIAGNOSIS — E538 Deficiency of other specified B group vitamins: Secondary | ICD-10-CM | POA: Diagnosis not present

## 2022-11-24 DIAGNOSIS — E038 Other specified hypothyroidism: Secondary | ICD-10-CM | POA: Diagnosis not present

## 2022-11-24 DIAGNOSIS — E119 Type 2 diabetes mellitus without complications: Secondary | ICD-10-CM | POA: Diagnosis not present

## 2022-11-24 DIAGNOSIS — I10 Essential (primary) hypertension: Secondary | ICD-10-CM | POA: Diagnosis not present

## 2022-11-24 DIAGNOSIS — I4891 Unspecified atrial fibrillation: Secondary | ICD-10-CM | POA: Diagnosis not present

## 2022-12-13 DIAGNOSIS — N189 Chronic kidney disease, unspecified: Secondary | ICD-10-CM | POA: Diagnosis not present

## 2022-12-13 DIAGNOSIS — I10 Essential (primary) hypertension: Secondary | ICD-10-CM | POA: Diagnosis not present

## 2022-12-13 DIAGNOSIS — I4891 Unspecified atrial fibrillation: Secondary | ICD-10-CM | POA: Diagnosis not present

## 2022-12-13 DIAGNOSIS — E038 Other specified hypothyroidism: Secondary | ICD-10-CM | POA: Diagnosis not present

## 2022-12-13 DIAGNOSIS — G8929 Other chronic pain: Secondary | ICD-10-CM | POA: Diagnosis not present

## 2022-12-13 DIAGNOSIS — D518 Other vitamin B12 deficiency anemias: Secondary | ICD-10-CM | POA: Diagnosis not present

## 2022-12-13 DIAGNOSIS — E119 Type 2 diabetes mellitus without complications: Secondary | ICD-10-CM | POA: Diagnosis not present

## 2022-12-13 DIAGNOSIS — E559 Vitamin D deficiency, unspecified: Secondary | ICD-10-CM | POA: Diagnosis not present

## 2022-12-13 DIAGNOSIS — E7849 Other hyperlipidemia: Secondary | ICD-10-CM | POA: Diagnosis not present

## 2022-12-22 DIAGNOSIS — R5381 Other malaise: Secondary | ICD-10-CM | POA: Diagnosis not present

## 2022-12-22 DIAGNOSIS — E119 Type 2 diabetes mellitus without complications: Secondary | ICD-10-CM | POA: Diagnosis not present

## 2022-12-22 DIAGNOSIS — G4700 Insomnia, unspecified: Secondary | ICD-10-CM | POA: Diagnosis not present

## 2022-12-22 DIAGNOSIS — N189 Chronic kidney disease, unspecified: Secondary | ICD-10-CM | POA: Diagnosis not present

## 2022-12-22 DIAGNOSIS — E559 Vitamin D deficiency, unspecified: Secondary | ICD-10-CM | POA: Diagnosis not present

## 2022-12-22 DIAGNOSIS — I4891 Unspecified atrial fibrillation: Secondary | ICD-10-CM | POA: Diagnosis not present

## 2022-12-22 DIAGNOSIS — G309 Alzheimer's disease, unspecified: Secondary | ICD-10-CM | POA: Diagnosis not present

## 2022-12-22 DIAGNOSIS — I10 Essential (primary) hypertension: Secondary | ICD-10-CM | POA: Diagnosis not present

## 2022-12-24 DIAGNOSIS — F5105 Insomnia due to other mental disorder: Secondary | ICD-10-CM | POA: Diagnosis not present

## 2022-12-24 DIAGNOSIS — F03B11 Unspecified dementia, moderate, with agitation: Secondary | ICD-10-CM | POA: Diagnosis not present

## 2022-12-28 DIAGNOSIS — E119 Type 2 diabetes mellitus without complications: Secondary | ICD-10-CM | POA: Diagnosis not present

## 2022-12-28 DIAGNOSIS — E782 Mixed hyperlipidemia: Secondary | ICD-10-CM | POA: Diagnosis not present

## 2022-12-28 DIAGNOSIS — Z79899 Other long term (current) drug therapy: Secondary | ICD-10-CM | POA: Diagnosis not present

## 2022-12-28 DIAGNOSIS — E038 Other specified hypothyroidism: Secondary | ICD-10-CM | POA: Diagnosis not present

## 2022-12-28 DIAGNOSIS — D518 Other vitamin B12 deficiency anemias: Secondary | ICD-10-CM | POA: Diagnosis not present

## 2023-01-03 DIAGNOSIS — E1165 Type 2 diabetes mellitus with hyperglycemia: Secondary | ICD-10-CM | POA: Diagnosis not present

## 2023-01-15 DIAGNOSIS — E1165 Type 2 diabetes mellitus with hyperglycemia: Secondary | ICD-10-CM | POA: Diagnosis not present

## 2023-01-19 DIAGNOSIS — R451 Restlessness and agitation: Secondary | ICD-10-CM | POA: Diagnosis not present

## 2023-01-19 DIAGNOSIS — I10 Essential (primary) hypertension: Secondary | ICD-10-CM | POA: Diagnosis not present

## 2023-01-19 DIAGNOSIS — E559 Vitamin D deficiency, unspecified: Secondary | ICD-10-CM | POA: Diagnosis not present

## 2023-01-19 DIAGNOSIS — U071 COVID-19: Secondary | ICD-10-CM | POA: Diagnosis not present

## 2023-01-19 DIAGNOSIS — G309 Alzheimer's disease, unspecified: Secondary | ICD-10-CM | POA: Diagnosis not present

## 2023-01-19 DIAGNOSIS — N189 Chronic kidney disease, unspecified: Secondary | ICD-10-CM | POA: Diagnosis not present

## 2023-01-19 DIAGNOSIS — G8929 Other chronic pain: Secondary | ICD-10-CM | POA: Diagnosis not present

## 2023-01-19 DIAGNOSIS — I4891 Unspecified atrial fibrillation: Secondary | ICD-10-CM | POA: Diagnosis not present

## 2023-01-19 DIAGNOSIS — N3080 Other cystitis without hematuria: Secondary | ICD-10-CM | POA: Diagnosis not present

## 2023-01-19 DIAGNOSIS — E119 Type 2 diabetes mellitus without complications: Secondary | ICD-10-CM | POA: Diagnosis not present

## 2023-01-20 DIAGNOSIS — F03B11 Unspecified dementia, moderate, with agitation: Secondary | ICD-10-CM | POA: Diagnosis not present

## 2023-01-20 DIAGNOSIS — F5105 Insomnia due to other mental disorder: Secondary | ICD-10-CM | POA: Diagnosis not present

## 2023-01-21 DIAGNOSIS — E119 Type 2 diabetes mellitus without complications: Secondary | ICD-10-CM | POA: Diagnosis not present

## 2023-01-21 DIAGNOSIS — H4010X0 Unspecified open-angle glaucoma, stage unspecified: Secondary | ICD-10-CM | POA: Diagnosis not present

## 2023-01-21 DIAGNOSIS — Z961 Presence of intraocular lens: Secondary | ICD-10-CM | POA: Diagnosis not present

## 2023-01-28 DIAGNOSIS — D518 Other vitamin B12 deficiency anemias: Secondary | ICD-10-CM | POA: Diagnosis not present

## 2023-01-28 DIAGNOSIS — G8929 Other chronic pain: Secondary | ICD-10-CM | POA: Diagnosis not present

## 2023-01-28 DIAGNOSIS — E119 Type 2 diabetes mellitus without complications: Secondary | ICD-10-CM | POA: Diagnosis not present

## 2023-01-28 DIAGNOSIS — I10 Essential (primary) hypertension: Secondary | ICD-10-CM | POA: Diagnosis not present

## 2023-01-28 DIAGNOSIS — E7849 Other hyperlipidemia: Secondary | ICD-10-CM | POA: Diagnosis not present

## 2023-01-28 DIAGNOSIS — N189 Chronic kidney disease, unspecified: Secondary | ICD-10-CM | POA: Diagnosis not present

## 2023-01-28 DIAGNOSIS — E559 Vitamin D deficiency, unspecified: Secondary | ICD-10-CM | POA: Diagnosis not present

## 2023-01-28 DIAGNOSIS — I4891 Unspecified atrial fibrillation: Secondary | ICD-10-CM | POA: Diagnosis not present

## 2023-01-28 DIAGNOSIS — E038 Other specified hypothyroidism: Secondary | ICD-10-CM | POA: Diagnosis not present

## 2023-02-02 DIAGNOSIS — E1165 Type 2 diabetes mellitus with hyperglycemia: Secondary | ICD-10-CM | POA: Diagnosis not present

## 2023-02-09 DIAGNOSIS — N3080 Other cystitis without hematuria: Secondary | ICD-10-CM | POA: Diagnosis not present

## 2023-02-11 DIAGNOSIS — G309 Alzheimer's disease, unspecified: Secondary | ICD-10-CM | POA: Diagnosis not present

## 2023-02-11 DIAGNOSIS — R5381 Other malaise: Secondary | ICD-10-CM | POA: Diagnosis not present

## 2023-02-11 DIAGNOSIS — F419 Anxiety disorder, unspecified: Secondary | ICD-10-CM | POA: Diagnosis not present

## 2023-02-11 DIAGNOSIS — I10 Essential (primary) hypertension: Secondary | ICD-10-CM | POA: Diagnosis not present

## 2023-02-11 DIAGNOSIS — N189 Chronic kidney disease, unspecified: Secondary | ICD-10-CM | POA: Diagnosis not present

## 2023-02-11 DIAGNOSIS — E119 Type 2 diabetes mellitus without complications: Secondary | ICD-10-CM | POA: Diagnosis not present

## 2023-02-11 DIAGNOSIS — G4700 Insomnia, unspecified: Secondary | ICD-10-CM | POA: Diagnosis not present

## 2023-02-11 DIAGNOSIS — N390 Urinary tract infection, site not specified: Secondary | ICD-10-CM | POA: Diagnosis not present

## 2023-02-11 DIAGNOSIS — I4891 Unspecified atrial fibrillation: Secondary | ICD-10-CM | POA: Diagnosis not present

## 2023-02-19 DIAGNOSIS — E1165 Type 2 diabetes mellitus with hyperglycemia: Secondary | ICD-10-CM | POA: Diagnosis not present

## 2023-02-23 DIAGNOSIS — G309 Alzheimer's disease, unspecified: Secondary | ICD-10-CM | POA: Diagnosis not present

## 2023-02-23 DIAGNOSIS — I4891 Unspecified atrial fibrillation: Secondary | ICD-10-CM | POA: Diagnosis not present

## 2023-02-23 DIAGNOSIS — R5381 Other malaise: Secondary | ICD-10-CM | POA: Diagnosis not present

## 2023-02-23 DIAGNOSIS — E559 Vitamin D deficiency, unspecified: Secondary | ICD-10-CM | POA: Diagnosis not present

## 2023-02-23 DIAGNOSIS — G4700 Insomnia, unspecified: Secondary | ICD-10-CM | POA: Diagnosis not present

## 2023-02-23 DIAGNOSIS — N189 Chronic kidney disease, unspecified: Secondary | ICD-10-CM | POA: Diagnosis not present

## 2023-02-23 DIAGNOSIS — E119 Type 2 diabetes mellitus without complications: Secondary | ICD-10-CM | POA: Diagnosis not present

## 2023-02-23 DIAGNOSIS — G8929 Other chronic pain: Secondary | ICD-10-CM | POA: Diagnosis not present

## 2023-02-23 DIAGNOSIS — I10 Essential (primary) hypertension: Secondary | ICD-10-CM | POA: Diagnosis not present

## 2023-02-26 DIAGNOSIS — D518 Other vitamin B12 deficiency anemias: Secondary | ICD-10-CM | POA: Diagnosis not present

## 2023-02-26 DIAGNOSIS — I4891 Unspecified atrial fibrillation: Secondary | ICD-10-CM | POA: Diagnosis not present

## 2023-02-26 DIAGNOSIS — E7849 Other hyperlipidemia: Secondary | ICD-10-CM | POA: Diagnosis not present

## 2023-02-26 DIAGNOSIS — E038 Other specified hypothyroidism: Secondary | ICD-10-CM | POA: Diagnosis not present

## 2023-02-26 DIAGNOSIS — G8929 Other chronic pain: Secondary | ICD-10-CM | POA: Diagnosis not present

## 2023-02-26 DIAGNOSIS — E119 Type 2 diabetes mellitus without complications: Secondary | ICD-10-CM | POA: Diagnosis not present

## 2023-02-26 DIAGNOSIS — N189 Chronic kidney disease, unspecified: Secondary | ICD-10-CM | POA: Diagnosis not present

## 2023-02-26 DIAGNOSIS — E559 Vitamin D deficiency, unspecified: Secondary | ICD-10-CM | POA: Diagnosis not present

## 2023-02-26 DIAGNOSIS — I10 Essential (primary) hypertension: Secondary | ICD-10-CM | POA: Diagnosis not present

## 2023-03-01 DIAGNOSIS — F03B11 Unspecified dementia, moderate, with agitation: Secondary | ICD-10-CM | POA: Diagnosis not present

## 2023-03-01 DIAGNOSIS — F5105 Insomnia due to other mental disorder: Secondary | ICD-10-CM | POA: Diagnosis not present

## 2023-03-04 ENCOUNTER — Emergency Department: Payer: Medicare HMO

## 2023-03-04 ENCOUNTER — Other Ambulatory Visit: Payer: Self-pay

## 2023-03-04 ENCOUNTER — Emergency Department
Admission: EM | Admit: 2023-03-04 | Discharge: 2023-03-04 | Disposition: A | Discharge status: Home / Self Care | Payer: Medicare HMO | Attending: Emergency Medicine | Admitting: Emergency Medicine

## 2023-03-04 DIAGNOSIS — Z7901 Long term (current) use of anticoagulants: Secondary | ICD-10-CM | POA: Insufficient documentation

## 2023-03-04 DIAGNOSIS — F0390 Unspecified dementia without behavioral disturbance: Secondary | ICD-10-CM | POA: Insufficient documentation

## 2023-03-04 DIAGNOSIS — R918 Other nonspecific abnormal finding of lung field: Secondary | ICD-10-CM | POA: Diagnosis not present

## 2023-03-04 DIAGNOSIS — R079 Chest pain, unspecified: Secondary | ICD-10-CM | POA: Insufficient documentation

## 2023-03-04 DIAGNOSIS — Z7401 Bed confinement status: Secondary | ICD-10-CM | POA: Diagnosis not present

## 2023-03-04 DIAGNOSIS — R0789 Other chest pain: Secondary | ICD-10-CM | POA: Diagnosis not present

## 2023-03-04 DIAGNOSIS — I10 Essential (primary) hypertension: Secondary | ICD-10-CM | POA: Diagnosis not present

## 2023-03-04 DIAGNOSIS — I700 Atherosclerosis of aorta: Secondary | ICD-10-CM | POA: Diagnosis not present

## 2023-03-04 DIAGNOSIS — E119 Type 2 diabetes mellitus without complications: Secondary | ICD-10-CM | POA: Insufficient documentation

## 2023-03-04 LAB — CBC
HCT: 38.8 % (ref 36.0–46.0)
Hemoglobin: 12.2 g/dL (ref 12.0–15.0)
MCH: 28.6 pg (ref 26.0–34.0)
MCHC: 31.4 g/dL (ref 30.0–36.0)
MCV: 91.1 fL (ref 80.0–100.0)
Platelets: 205 10*3/uL (ref 150–400)
RBC: 4.26 MIL/uL (ref 3.87–5.11)
RDW: 14.1 % (ref 11.5–15.5)
WBC: 7.8 10*3/uL (ref 4.0–10.5)
nRBC: 0 % (ref 0.0–0.2)

## 2023-03-04 LAB — BASIC METABOLIC PANEL
Anion gap: 10 (ref 5–15)
BUN: 23 mg/dL (ref 8–23)
CO2: 27 mmol/L (ref 22–32)
Calcium: 9.1 mg/dL (ref 8.9–10.3)
Chloride: 98 mmol/L (ref 98–111)
Creatinine, Ser: 1.09 mg/dL — ABNORMAL HIGH (ref 0.44–1.00)
GFR, Estimated: 51 mL/min — ABNORMAL LOW (ref >60)
Glucose, Bld: 109 mg/dL — ABNORMAL HIGH (ref 70–99)
Potassium: 4.2 mmol/L (ref 3.5–5.1)
Sodium: 135 mmol/L (ref 135–145)

## 2023-03-04 LAB — APTT: aPTT: 35 seconds (ref 24–36)

## 2023-03-04 LAB — TROPONIN I (HIGH SENSITIVITY)
Troponin I (High Sensitivity): 10 ng/L (ref <18)
Troponin I (High Sensitivity): 10 ng/L (ref <18)

## 2023-03-04 NOTE — ED Notes (Signed)
First Nurse Note: Patient to ED via [facility] from [facility] for chest pain. Upon EMS arrival patient states chest pain had resolved- hx of dementia. EKG showed flutter in the 80's. No known cardiac history. VS WNL.

## 2023-03-04 NOTE — Discharge Instructions (Addendum)
Your testing in the emergency department showed no signs of a emergencies to explain your symptoms like heart attack or infection.  Follow-up with your doctor this week

## 2023-03-04 NOTE — ED Notes (Signed)
Pt found wondering around hall. Pt redirected to room. Bed rails up, bed alarm on, bed locked and lower to lowest position.

## 2023-03-04 NOTE — ED Provider Notes (Signed)
[facility] Provider Note    Event Date/Time   First MD Initiated Contact with Patient 03/04/23 1440     (approximate)   History   Chest Pain   HPI  Jenna Odom is a 82 y.o. female   Past medical history of atrial fibrillation, dementia, diabetes, hypertension, on Eliquis who presents emergency department with chest pain.  This was reported from her care facility.  She denies chest pain now, has no acute medical complaints.  She feels well and wants to go back to her living facility.  On review of system asked about chest pain, shortness of breath, trauma, abdominal pain, nausea, vomiting, fever and she denies all the symptoms.   External Medical Documents Reviewed: Cardiology note dated 2021 with Dr. Gwen Pounds who addressed her atrial fibrillation reviewed past medical history including cardiac history      Physical Exam   Triage Vital Signs: ED Triage Vitals  Enc Vitals Group     BP 03/04/23 1312 138/83     Pulse Rate 03/04/23 1312 68     Resp 03/04/23 1312 14     Temp 03/04/23 1312 97.9 F (36.6 C)     Temp src --      SpO2 03/04/23 1312 100 %     Weight 03/04/23 1312 143 lb 1.3 oz (64.9 kg)     Height 03/04/23 1312 5\' 3"  (1.6 m)     Head Circumference --      Peak Flow --      Pain Score --      Pain Loc --      Pain Edu? --      Excl. in GC? --     Most recent vital signs: Vitals:   03/04/23 1312  BP: 138/83  Pulse: 68  Resp: 14  Temp: 97.9 F (36.6 C)  SpO2: 100%    General: Awake, no distress.  CV:  Good peripheral perfusion.  Resp:  Normal effort.  Abd:  No distention.  Other:  Awake alert comfortable appearing.  Oriented to self and situation.  Pleasant smiling at this provider.  No chest wall signs of trauma, tenderness to palpation.  Abdomen soft nontender deep palpation all quadrants and her lungs are clear.  She appears euvolemic.  Radial pulses intact bilaterally.   ED Results / Procedures / Treatments    Labs (all labs ordered are listed, but only abnormal results are displayed) Labs Reviewed  BASIC METABOLIC PANEL - Abnormal; Notable for the following components:      Result Value   Glucose, Bld 109 (*)    Creatinine, Ser 1.09 (*)    GFR, Estimated 51 (*)    All other components within normal limits  CBC  APTT  TROPONIN I (HIGH SENSITIVITY)  TROPONIN I (HIGH SENSITIVITY)     I ordered and reviewed the above labs they are notable for troponins are flat  EKG  ED ECG REPORT I, Pilar Jarvis, the attending physician, personally viewed and interpreted this ECG.   Date: 03/04/2023  EKG Time: 1317  Rate: 71  Rhythm: A flutter  Axis: nl  Intervals:none  ST&T Change: no stemi    RADIOLOGY I independently reviewed and interpreted chest x-ray see no obvious focality pneumothorax   PROCEDURES:  Critical Care performed: No  Procedures   MEDICATIONS ORDERED IN ED: Medications - No data to display  IMPRESSION / MDM / ASSESSMENT AND PLAN / ED COURSE  I reviewed the triage vital signs and the  nursing notes.                                Patient's presentation is most consistent with acute presentation with potential threat to life or bodily function.  Differential diagnosis includes, but is not limited to, ACS, PE, dissection, musculoskeletal pain, costochondritis, respiratory infection, pneumothorax, rib fracture, pancreatitis, cholecystitis, intra-abdominal infection or obstruction   The patient is on the cardiac monitor to evaluate for evidence of arrhythmia and/or significant heart rate changes.  MDM: Transient chest pain completely resolved no complaints and patient with a benign exam normal vital signs and negative workup including serial troponins and chest x-ray.  EKG shows atrial flutter, rate controlled, no ischemic changes.  Since workup unremarkable, patient comfortable at this time with no complaints and unremarkable evaluation, will be discharged back to  facility with PMD follow-up.       FINAL CLINICAL IMPRESSION(S) / ED DIAGNOSES   Final diagnoses:  Nonspecific chest pain     Rx / DC Orders   ED Discharge Orders     None        Note:  This document was prepared using Dragon voice recognition software and may include unintentional dictation errors.    Pilar Jarvis, MD 03/04/23 1600

## 2023-03-04 NOTE — ED Notes (Signed)
RN went in to dc IV pt had pulled it out. Blood noted to shirt and sheets. Cleaned up pt and coban and 2x2 placed

## 2023-03-04 NOTE — ED Triage Notes (Addendum)
Pt here from [facility] via [facility] with cp. When asked about her pain, pt states she is here for the bathroom and to have a baby. Pt now denies cp. Pt has hx of dementia.  Staff at assisted living called ems d/t pt c/o cp earlier in the day.

## 2023-03-05 DIAGNOSIS — E1165 Type 2 diabetes mellitus with hyperglycemia: Secondary | ICD-10-CM | POA: Diagnosis not present

## 2023-10-26 ENCOUNTER — Other Ambulatory Visit: Payer: Self-pay

## 2023-10-26 ENCOUNTER — Encounter: Payer: Self-pay | Admitting: Emergency Medicine

## 2023-10-26 ENCOUNTER — Inpatient Hospital Stay: Payer: Medicare HMO

## 2023-10-26 ENCOUNTER — Inpatient Hospital Stay
Admission: EM | Admit: 2023-10-26 | Discharge: 2023-11-03 | DRG: 394 | Disposition: A | Discharge status: Home / Self Care | Payer: Medicare HMO | Source: Skilled Nursing Facility | Attending: Internal Medicine | Admitting: Internal Medicine

## 2023-10-26 ENCOUNTER — Emergency Department: Payer: Medicare HMO

## 2023-10-26 DIAGNOSIS — F0390 Unspecified dementia without behavioral disturbance: Secondary | ICD-10-CM

## 2023-10-26 DIAGNOSIS — K641 Second degree hemorrhoids: Secondary | ICD-10-CM | POA: Diagnosis present

## 2023-10-26 DIAGNOSIS — I10 Essential (primary) hypertension: Secondary | ICD-10-CM | POA: Diagnosis present

## 2023-10-26 DIAGNOSIS — Z794 Long term (current) use of insulin: Secondary | ICD-10-CM | POA: Diagnosis not present

## 2023-10-26 DIAGNOSIS — Z90710 Acquired absence of both cervix and uterus: Secondary | ICD-10-CM | POA: Diagnosis not present

## 2023-10-26 DIAGNOSIS — I4891 Unspecified atrial fibrillation: Secondary | ICD-10-CM | POA: Diagnosis present

## 2023-10-26 DIAGNOSIS — D500 Iron deficiency anemia secondary to blood loss (chronic): Secondary | ICD-10-CM

## 2023-10-26 DIAGNOSIS — Z7984 Long term (current) use of oral hypoglycemic drugs: Secondary | ICD-10-CM

## 2023-10-26 DIAGNOSIS — K625 Hemorrhage of anus and rectum: Secondary | ICD-10-CM

## 2023-10-26 DIAGNOSIS — R933 Abnormal findings on diagnostic imaging of other parts of digestive tract: Secondary | ICD-10-CM

## 2023-10-26 DIAGNOSIS — Z881 Allergy status to other antibiotic agents status: Secondary | ICD-10-CM

## 2023-10-26 DIAGNOSIS — Z79899 Other long term (current) drug therapy: Secondary | ICD-10-CM

## 2023-10-26 DIAGNOSIS — D62 Acute posthemorrhagic anemia: Secondary | ICD-10-CM | POA: Diagnosis present

## 2023-10-26 DIAGNOSIS — Z7901 Long term (current) use of anticoagulants: Secondary | ICD-10-CM

## 2023-10-26 DIAGNOSIS — K5730 Diverticulosis of large intestine without perforation or abscess without bleeding: Secondary | ICD-10-CM | POA: Diagnosis present

## 2023-10-26 DIAGNOSIS — E1165 Type 2 diabetes mellitus with hyperglycemia: Secondary | ICD-10-CM

## 2023-10-26 DIAGNOSIS — F03C0 Unspecified dementia, severe, without behavioral disturbance, psychotic disturbance, mood disturbance, and anxiety: Secondary | ICD-10-CM | POA: Diagnosis present

## 2023-10-26 DIAGNOSIS — E538 Deficiency of other specified B group vitamins: Secondary | ICD-10-CM | POA: Diagnosis present

## 2023-10-26 DIAGNOSIS — K921 Melena: Secondary | ICD-10-CM

## 2023-10-26 DIAGNOSIS — K922 Gastrointestinal hemorrhage, unspecified: Secondary | ICD-10-CM | POA: Diagnosis not present

## 2023-10-26 DIAGNOSIS — E1169 Type 2 diabetes mellitus with other specified complication: Secondary | ICD-10-CM | POA: Diagnosis not present

## 2023-10-26 DIAGNOSIS — R4189 Other symptoms and signs involving cognitive functions and awareness: Secondary | ICD-10-CM | POA: Diagnosis not present

## 2023-10-26 DIAGNOSIS — I959 Hypotension, unspecified: Secondary | ICD-10-CM | POA: Diagnosis not present

## 2023-10-26 DIAGNOSIS — K648 Other hemorrhoids: Secondary | ICD-10-CM | POA: Diagnosis not present

## 2023-10-26 DIAGNOSIS — E785 Hyperlipidemia, unspecified: Secondary | ICD-10-CM | POA: Diagnosis not present

## 2023-10-26 DIAGNOSIS — Z6825 Body mass index (BMI) 25.0-25.9, adult: Secondary | ICD-10-CM

## 2023-10-26 DIAGNOSIS — E46 Unspecified protein-calorie malnutrition: Secondary | ICD-10-CM | POA: Diagnosis present

## 2023-10-26 DIAGNOSIS — G3184 Mild cognitive impairment, so stated: Secondary | ICD-10-CM | POA: Diagnosis present

## 2023-10-26 LAB — URINALYSIS, ROUTINE W REFLEX MICROSCOPIC
Bacteria, UA: NONE SEEN
Bilirubin Urine: NEGATIVE
Glucose, UA: 50 mg/dL — AB
Ketones, ur: NEGATIVE mg/dL
Nitrite: NEGATIVE
Protein, ur: NEGATIVE mg/dL
Specific Gravity, Urine: 1.032 — ABNORMAL HIGH (ref 1.005–1.030)
pH: 6 (ref 5.0–8.0)

## 2023-10-26 LAB — PROTIME-INR
INR: 1.4 — ABNORMAL HIGH (ref 0.8–1.2)
Prothrombin Time: 17.3 s — ABNORMAL HIGH (ref 11.4–15.2)

## 2023-10-26 LAB — COMPREHENSIVE METABOLIC PANEL
ALT: 12 U/L (ref 0–44)
AST: 17 U/L (ref 15–41)
Albumin: 3.5 g/dL (ref 3.5–5.0)
Alkaline Phosphatase: 39 U/L (ref 38–126)
Anion gap: 7 (ref 5–15)
BUN: 24 mg/dL — ABNORMAL HIGH (ref 8–23)
CO2: 27 mmol/L (ref 22–32)
Calcium: 8.5 mg/dL — ABNORMAL LOW (ref 8.9–10.3)
Chloride: 102 mmol/L (ref 98–111)
Creatinine, Ser: 1.01 mg/dL — ABNORMAL HIGH (ref 0.44–1.00)
GFR, Estimated: 56 mL/min — ABNORMAL LOW (ref >60)
Glucose, Bld: 222 mg/dL — ABNORMAL HIGH (ref 70–99)
Potassium: 4.8 mmol/L (ref 3.5–5.1)
Sodium: 136 mmol/L (ref 135–145)
Total Bilirubin: 0.6 mg/dL (ref 0.0–1.2)
Total Protein: 5.7 g/dL — ABNORMAL LOW (ref 6.5–8.1)

## 2023-10-26 LAB — CBC
HCT: 21.1 % — ABNORMAL LOW (ref 36.0–46.0)
Hemoglobin: 6.6 g/dL — ABNORMAL LOW (ref 12.0–15.0)
MCH: 27.8 pg (ref 26.0–34.0)
MCHC: 31.3 g/dL (ref 30.0–36.0)
MCV: 89 fL (ref 80.0–100.0)
Platelets: 226 10*3/uL (ref 150–400)
RBC: 2.37 MIL/uL — ABNORMAL LOW (ref 3.87–5.11)
RDW: 14 % (ref 11.5–15.5)
WBC: 4.9 10*3/uL (ref 4.0–10.5)
nRBC: 0 % (ref 0.0–0.2)

## 2023-10-26 LAB — CBG MONITORING, ED
Glucose-Capillary: 148 mg/dL — ABNORMAL HIGH (ref 70–99)
Glucose-Capillary: 169 mg/dL — ABNORMAL HIGH (ref 70–99)
Glucose-Capillary: 56 mg/dL — ABNORMAL LOW (ref 70–99)

## 2023-10-26 LAB — APTT: aPTT: 35 s (ref 24–36)

## 2023-10-26 LAB — PREPARE RBC (CROSSMATCH)

## 2023-10-26 LAB — ABO/RH: ABO/RH(D): B NEG

## 2023-10-26 LAB — HEMOGLOBIN AND HEMATOCRIT, BLOOD
HCT: 25.5 % — ABNORMAL LOW (ref 36.0–46.0)
Hemoglobin: 8.1 g/dL — ABNORMAL LOW (ref 12.0–15.0)

## 2023-10-26 MED ORDER — PEG 3350-KCL-NA BICARB-NACL 420 G PO SOLR
4000.0000 mL | Freq: Once | ORAL | Status: AC
  Administered 2023-10-26: 4000 mL via ORAL
  Filled 2023-10-26: qty 4000

## 2023-10-26 MED ORDER — INSULIN ASPART 100 UNIT/ML IJ SOLN
0.0000 [IU] | INTRAMUSCULAR | Status: DC
  Administered 2023-10-26: 2 [IU] via SUBCUTANEOUS
  Administered 2023-10-27: 3 [IU] via SUBCUTANEOUS
  Administered 2023-10-27: 1 [IU] via SUBCUTANEOUS
  Administered 2023-10-28 – 2023-10-29 (×4): 2 [IU] via SUBCUTANEOUS
  Administered 2023-10-29: 1 [IU] via SUBCUTANEOUS
  Administered 2023-10-29: 2 [IU] via SUBCUTANEOUS
  Administered 2023-10-30: 7 [IU] via SUBCUTANEOUS
  Administered 2023-10-30: 2 [IU] via SUBCUTANEOUS
  Administered 2023-10-30: 1 [IU] via SUBCUTANEOUS
  Administered 2023-10-30: 2 [IU] via SUBCUTANEOUS
  Administered 2023-10-30 – 2023-10-31 (×2): 1 [IU] via SUBCUTANEOUS
  Administered 2023-10-31: 2 [IU] via SUBCUTANEOUS
  Administered 2023-10-31: 1 [IU] via SUBCUTANEOUS
  Administered 2023-10-31: 2 [IU] via SUBCUTANEOUS
  Administered 2023-10-31 – 2023-11-01 (×4): 1 [IU] via SUBCUTANEOUS
  Administered 2023-11-01: 3 [IU] via SUBCUTANEOUS
  Administered 2023-11-02 (×2): 2 [IU] via SUBCUTANEOUS
  Filled 2023-10-26 (×23): qty 1

## 2023-10-26 MED ORDER — HALOPERIDOL LACTATE 5 MG/ML IJ SOLN
2.0000 mg | Freq: Four times a day (QID) | INTRAMUSCULAR | Status: DC | PRN
  Administered 2023-10-26 – 2023-10-31 (×6): 2 mg via INTRAVENOUS
  Filled 2023-10-26 (×7): qty 1

## 2023-10-26 MED ORDER — SODIUM CHLORIDE 0.9 % IV SOLN: INTRAVENOUS | Status: DC

## 2023-10-26 MED ORDER — IOHEXOL 350 MG/ML SOLN
100.0000 mL | Freq: Once | INTRAVENOUS | Status: AC | PRN
  Administered 2023-10-26: 100 mL via INTRAVENOUS

## 2023-10-26 MED ORDER — DEXTROSE 50 % IV SOLN
INTRAVENOUS | Status: AC
  Administered 2023-10-26: 25 g via INTRAVENOUS
  Filled 2023-10-26: qty 50

## 2023-10-26 MED ORDER — EMPTY CONTAINERS FLEXIBLE MISC
900.0000 mg | Freq: Once | Status: AC
  Administered 2023-10-26: 900 mg via INTRAVENOUS
  Filled 2023-10-26: qty 90

## 2023-10-26 MED ORDER — OLANZAPINE 10 MG IM SOLR
2.5000 mg | Freq: Four times a day (QID) | INTRAMUSCULAR | Status: DC | PRN
Start: 2023-10-26 — End: 2023-10-26
  Administered 2023-10-26: 2.5 mg via INTRAMUSCULAR
  Filled 2023-10-26 (×2): qty 10

## 2023-10-26 MED ORDER — DEXTROSE 50 % IV SOLN: 25.0000 g | Freq: Once | INTRAVENOUS | Status: AC

## 2023-10-26 NOTE — ED Notes (Signed)
 This NT and nurse Emilee cleaned up pt after BM. Pt has on clean brief and clean chux.

## 2023-10-26 NOTE — ED Triage Notes (Signed)
 Pt here via [facility] from [facility] with rectal bleeding and abd pain x1 week. Pt states blood was dark red with no clots. Pt is not on a blood thinner. Pt denies NVD.

## 2023-10-26 NOTE — ED Notes (Signed)
 Non-violent wrist restraints applied at this time

## 2023-10-26 NOTE — ED Notes (Signed)
 CBG 56, Jawo NP notified. Awaiting orders.

## 2023-10-26 NOTE — ED Provider Notes (Signed)
 [facility] Provider Note    Event Date/Time   First MD Initiated Contact with Patient 10/26/23 1036     (approximate)   History   Rectal Bleeding and Abdominal Pain   HPI  Jenna Odom is a 83 y.o. female who presents to the ED for evaluation of Rectal Bleeding and Abdominal Pain   Review of clinic visit from multiple years ago.  History of DM, HTN.  A-fib on Eliquis  Patient presents for evaluation of abdominal discomfort and lower GI bleeding.  History is limited as patient is pleasantly demented.  She believes that she is here because she is not pregnant.   Physical Exam   Triage Vital Signs: ED Triage Vitals  Encounter Vitals Group     BP 10/26/23 0930 113/60     Systolic BP Percentile --      Diastolic BP Percentile --      Pulse Rate 10/26/23 0930 (!) 55     Resp 10/26/23 0930 17     Temp 10/26/23 0930 97.9 F (36.6 C)     Temp Source 10/26/23 0930 Oral     SpO2 10/26/23 0930 99 %     Weight 10/26/23 0931 143 lb 1.3 oz (64.9 kg)     Height 10/26/23 0931 5\' 3"  (1.6 m)     Head Circumference --      Peak Flow --      Pain Score 10/26/23 0931 5     Pain Loc --      Pain Education --      Exclude from Growth Chart --     Most recent vital signs: Vitals:   10/26/23 0930  BP: 113/60  Pulse: (!) 55  Resp: 17  Temp: 97.9 F (36.6 C)  SpO2: 99%    General: Awake, no distress.  Pleasantly disoriented, looks well CV:  Good peripheral perfusion.  Resp:  Normal effort.  Abd:  No distention.  Some mild lower abdominal discomfort on palpation, no guarding or peritoneal features. MSK:  No deformity noted.  Neuro:  No focal deficits appreciated. Other:     ED Results / Procedures / Treatments   Labs (all labs ordered are listed, but only abnormal results are displayed) Labs Reviewed  COMPREHENSIVE METABOLIC PANEL - Abnormal; Notable for the following components:      Result Value   Glucose, Bld 222 (*)    BUN 24 (*)     Creatinine, Ser 1.01 (*)    Calcium 8.5 (*)    Total Protein 5.7 (*)    GFR, Estimated 56 (*)    All other components within normal limits  CBC - Abnormal; Notable for the following components:   RBC 2.37 (*)    Hemoglobin 6.6 (*)    HCT 21.1 (*)    All other components within normal limits  PROTIME-INR - Abnormal; Notable for the following components:   Prothrombin Time 17.3 (*)    INR 1.4 (*)    All other components within normal limits  APTT  URINALYSIS, ROUTINE W REFLEX MICROSCOPIC  TYPE AND SCREEN  PREPARE RBC (CROSSMATCH)  ABO/RH    EKG   RADIOLOGY CTA GI bleed study with venous blood in the rectum  Official radiology report(s): CT ANGIO GI BLEED Result Date: 10/26/2023 CLINICAL DATA:  Lower abdominal pain, hematochezia, blood loss anemia requiring transfusion, rectal bleeding for 1 week EXAM: CTA ABDOMEN AND PELVIS WITHOUT AND WITH CONTRAST TECHNIQUE: Multidetector CT imaging of the abdomen and pelvis  was performed using the standard protocol during bolus administration of intravenous contrast. Multiplanar reconstructed images and MIPs were obtained and reviewed to evaluate the vascular anatomy. RADIATION DOSE REDUCTION: This exam was performed according to the departmental dose-optimization program which includes automated exposure control, adjustment of the mA and/or kV according to patient size and/or use of iterative reconstruction technique. CONTRAST:  OMNIPAQUE IOHEXOL 350 MG/ML SOLN COMPARISON:  None Available. FINDINGS: VASCULAR Severe aortic atherosclerosis. Normal contour and caliber of the abdominal aorta. No evidence of aneurysm, dissection, or other acute aortic pathology. Standard branching pattern of the abdominal aorta with solitary bilateral renal arteries. Review of the MIP images confirms the above findings. NON-VASCULAR Lower Chest: No acute findings.  Coronary artery calcifications. Hepatobiliary: No solid liver abnormality is seen. Tiny gallstones. No  gallbladder wall thickening, or biliary dilatation. Pancreas: Unremarkable. No pancreatic ductal dilatation or surrounding inflammatory changes. Spleen: Normal in size without significant abnormality. Adrenals/Urinary Tract: Adrenal glands are unremarkable. Lobulated renal cortical scarring. Kidneys are otherwise normal, without renal calculi, solid lesion, or hydronephrosis. Bladder is unremarkable. Stomach/Bowel: Stomach is within normal limits. Large periampullary duodenal diverticulum hyperdense ingested material within the gastric lumen on precontrast images. Appendix not clearly visualized. No evidence of bowel wall thickening, distention, or inflammatory changes. Sigmoid diverticula. Small volume of contrast within the rectal vault on venous phase imaging (series 17, image 199, 206). Sizable stool balls in the rectum. Lymphatic: No enlarged abdominal or pelvic lymph nodes. Reproductive: Status post hysterectomy. Other: No abdominal wall hernia or abnormality. No ascites. Musculoskeletal: No acute osseous findings. IMPRESSION: 1. Small volume of contrast within the rectal vault on venous phase imaging, suggestive of hemorrhoidal or stercoral nidus of hematochezia. Sizable stool balls in the rectum. 2. Sigmoid diverticulosis without evidence of acute diverticulitis or specific evidence of diverticular hemorrhage. 3. Cholelithiasis without evidence of acute cholecystitis. 4. Coronary artery disease. 5. Severe aortic atherosclerosis. Aortic Atherosclerosis (ICD10-I70.0). Electronically Signed   By: Jearld Lesch M.D.   On: 10/26/2023 11:47    PROCEDURES and INTERVENTIONS:  .Critical Care  Performed by: Delton Prairie, MD Authorized by: Delton Prairie, MD   Critical care provider statement:    Critical care time (minutes):  30   Critical care time was exclusive of:  Separately billable procedures and treating other patients   Critical care was necessary to treat or prevent imminent or life-threatening  deterioration of the following conditions:  Circulatory failure   Critical care was time spent personally by me on the following activities:  Development of treatment plan with patient or surrogate, discussions with consultants, evaluation of patient's response to treatment, examination of patient, ordering and review of laboratory studies, ordering and review of radiographic studies, ordering and performing treatments and interventions, pulse oximetry, re-evaluation of patient's condition and review of old charts   Medications  coag fact Xa recombinant (ANDEXXA) low dose infusion 900 mg (has no administration in time range)  iohexol (OMNIPAQUE) 350 MG/ML injection 100 mL (100 mLs Intravenous Contrast Given 10/26/23 1113)     IMPRESSION / MDM / ASSESSMENT AND PLAN / ED COURSE  I reviewed the triage vital signs and the nursing notes.  Differential diagnosis includes, but is not limited to, diverticular bleed, hemorrhoids, diverticulitis, blood loss anemia, sepsis  {Patient presents with symptoms of an acute illness or injury that is potentially life-threatening.  Pleasantly demented patient presents with lower GI bleeding and abdominal discomfort with evidence of blood loss anemia requiring blood transfusions.  Per MAR from her facility she  received Eliquis this morning so we will reversed with Andexxa.  Mild hyperglycemia but no acidosis.  CTA, as above.  Consult GI and medicine for admission.  Order 1 unit of PRBCs considering hemoglobin of 6.  Clinical Course as of 10/26/23 1237  Tue Oct 26, 2023  1051 Called daughter, Inetta Fermo. Last saw the patient on Sunday, no complaints then. Discussed low blood counts and my recommendation for transfusion, she's agreeable.  Discussed pending CT scan, admission and possible etiologies.  She is agreeable. [DS]  1219 I consult with Dr. Servando Snare, he reviews chart, CT, workup. Will see in consultation [DS]  1225 Consult with hospitalist who agrees to admit [DS]     Clinical Course User Index [DS] Delton Prairie, MD     FINAL CLINICAL IMPRESSION(S) / ED DIAGNOSES   Final diagnoses:  Gastrointestinal hemorrhage, unspecified gastrointestinal hemorrhage type  Anticoagulated  Blood loss anemia     Rx / DC Orders   ED Discharge Orders     None        Note:  This document was prepared using Dragon voice recognition software and may include unintentional dictation errors.   Delton Prairie, MD 10/26/23 [phone]

## 2023-10-26 NOTE — Assessment & Plan Note (Signed)
 ABLA  + rectal bleeding x 1 week coming from local SNF  CT angio GI bleed with concern for hemorrhoidal or stercoral nidus of Hematochezia Per Dr.Smith, case discussed w/ Dr. Servando Snare who will be formally evaluating the patient  Hgb 6.6  Pending pRBC transfusion  Trend hgb  Transfuse for hgb <7  Follow up GI recommendations  Anemia panel

## 2023-10-26 NOTE — H&P (Signed)
 History and Physical    Patient: Jenna Odom MWN:[id number] DOB: September 30, 1940 DOA: 10/26/2023 DOS: the patient was seen and examined on 10/26/2023 PCP: Galvin Proffer, MD  Patient coming from: SNF  Chief Complaint:  Chief Complaint  Patient presents with   Rectal Bleeding   Abdominal Pain   HPI: Jenna Odom is a 83 y.o. female with medical history significant of dementia, atrial fibrillation, type 2 diabetes, hypertension presented with rectal bleeding.  Limited history in the setting of dementia and cognitive impairment.  Patient noted of had rectal bleeding at local skilled nursing facility.  Has been present for roughly 1 week.  No reports of recent medication changes.  No reports of recent fall or trauma.  No reported chest pain, shortness of breath, nausea or vomiting.  On Eliquis in setting of atrial fibrillation.  Unclear if this has been stopped at local skilled nursing facility.  Presented to the ER afebrile, hemodynamically stable.  Satting well on room air.  White count 4.9, hemoglobin 6.6, platelets 226, creatinine 1.1, glucose 220s.  CT angio GI bleed with changes concerning for hemorrhoidal versus stercoral nidus of hematochezia.  Noted sizable stool balls in the rectum.  Positive diverticulosis without diverticulitis or evidence of diverticular hemorrhage.Dr. Servando Snare w/ GI consulted.  Review of Systems: unable to review all systems due to the inability of the patient to answer questions. Past Medical History:  Diagnosis Date   A-fib (HCC)    Anxiety    Dementia (HCC)    Diabetes mellitus without complication (HCC)    Hypertension    Past Surgical History:  Procedure Laterality Date   ABDOMINAL HYSTERECTOMY     Social History:  reports that she has never smoked. She has never used smokeless tobacco. She reports that she does not drink alcohol and does not use drugs.  Allergies  Allergen Reactions   Ciprofloxacin Nausea And Vomiting    Family History  Problem  Relation Age of Onset   Dementia Mother    Other Father        gun shot    Prior to Admission medications   Medication Sig Start Date End Date Taking? Authorizing Provider  Acetaminophen (TYLENOL PO) Take by mouth as needed.    [provider]  apixaban (ELIQUIS) 5 MG TABS tablet Take 1 tablet (5 mg total) by mouth 2 (two) times daily. cardio 06/30/19   Duanne Limerick, MD  busPIRone (BUSPAR) 5 MG tablet TAKE 1/2 (ONE-HALF) TABLET BY MOUTH IN THE MORNING AND 1 AT NIGHT 08/25/20   Duanne Limerick, MD  diclofenac Sodium (VOLTAREN) 1 % GEL Apply 2 g topically 4 (four) times daily. 04/12/20   Duanne Limerick, MD  Insulin Glargine (BASAGLAR KWIKPEN) 100 UNIT/ML Inject 0.12 mLs (12 Units total) into the skin daily. Patient taking differently: Inject 8 Units into the skin daily. 05/10/20   Duanne Limerick, MD  lisinopril (ZESTRIL) 2.5 MG tablet Take 1 tablet by mouth once daily 08/12/20   Duanne Limerick, MD  melatonin 3 MG TABS tablet Take 10 mg by mouth at bedtime.     [provider]  metFORMIN (GLUCOPHAGE) 1000 MG tablet Take 1 tablet (1,000 mg total) by mouth 2 (two) times daily with a meal. 05/10/20   Duanne Limerick, MD  metoprolol tartrate (LOPRESSOR) 25 MG tablet Take 1 tablet by mouth twice daily 07/16/20   Duanne Limerick, MD    Physical Exam: Vitals:   10/26/23 0930 10/26/23 0931 10/26/23 1337  BP: 113/60  110/60  Pulse: (!) 55  60  Resp: 17  16  Temp: 97.9 F (36.6 C)  (!) 97 F (36.1 C)  TempSrc: Oral  Oral  SpO2: 99%  99%  Weight:  64.9 kg   Height:  5\' 3"  (1.6 m)    Physical Exam Constitutional:      Appearance: She is normal weight.     Comments: + generalized confusion   HENT:     Head: Normocephalic and atraumatic.     Mouth/Throat:     Mouth: Mucous membranes are dry.  Eyes:     Extraocular Movements: Extraocular movements intact.     Pupils: Pupils are equal, round, and reactive to light.  Cardiovascular:     Rate and Rhythm: Normal rate and  regular rhythm.  Pulmonary:     Effort: Pulmonary effort is normal.  Abdominal:     General: Bowel sounds are normal.  Musculoskeletal:        General: Normal range of motion.  Skin:    General: Skin is warm.  Neurological:     General: No focal deficit present.  Psychiatric:        Mood and Affect: Mood normal.     Data Reviewed:  There are no new results to review at this time.  CT ANGIO GI BLEED CLINICAL DATA:  Lower abdominal pain, hematochezia, blood loss anemia requiring transfusion, rectal bleeding for 1 week  EXAM: CTA ABDOMEN AND PELVIS WITHOUT AND WITH CONTRAST  TECHNIQUE: Multidetector CT imaging of the abdomen and pelvis was performed using the standard protocol during bolus administration of intravenous contrast. Multiplanar reconstructed images and MIPs were obtained and reviewed to evaluate the vascular anatomy.  RADIATION DOSE REDUCTION: This exam was performed according to the departmental dose-optimization program which includes automated exposure control, adjustment of the mA and/or kV according to patient size and/or use of iterative reconstruction technique.  CONTRAST:  OMNIPAQUE IOHEXOL 350 MG/ML SOLN  COMPARISON:  None Available.  FINDINGS: VASCULAR  Severe aortic atherosclerosis. Normal contour and caliber of the abdominal aorta. No evidence of aneurysm, dissection, or other acute aortic pathology. Standard branching pattern of the abdominal aorta with solitary bilateral renal arteries.  Review of the MIP images confirms the above findings.  NON-VASCULAR  Lower Chest: No acute findings.  Coronary artery calcifications.  Hepatobiliary: No solid liver abnormality is seen. Tiny gallstones. No gallbladder wall thickening, or biliary dilatation.  Pancreas: Unremarkable. No pancreatic ductal dilatation or surrounding inflammatory changes.  Spleen: Normal in size without significant abnormality.  Adrenals/Urinary Tract: Adrenal  glands are unremarkable. Lobulated renal cortical scarring. Kidneys are otherwise normal, without renal calculi, solid lesion, or hydronephrosis. Bladder is unremarkable.  Stomach/Bowel: Stomach is within normal limits. Large periampullary duodenal diverticulum hyperdense ingested material within the gastric lumen on precontrast images. Appendix not clearly visualized. No evidence of bowel wall thickening, distention, or inflammatory changes. Sigmoid diverticula. Small volume of contrast within the rectal vault on venous phase imaging (series 17, image 199, 206). Sizable stool balls in the rectum.  Lymphatic: No enlarged abdominal or pelvic lymph nodes.  Reproductive: Status post hysterectomy.  Other: No abdominal wall hernia or abnormality. No ascites.  Musculoskeletal: No acute osseous findings.  IMPRESSION: 1. Small volume of contrast within the rectal vault on venous phase imaging, suggestive of hemorrhoidal or stercoral nidus of hematochezia. Sizable stool balls in the rectum. 2. Sigmoid diverticulosis without evidence of acute diverticulitis or specific evidence of diverticular hemorrhage. 3. Cholelithiasis without evidence  of acute cholecystitis. 4. Coronary artery disease. 5. Severe aortic atherosclerosis.  Aortic Atherosclerosis (ICD10-I70.0).  Electronically Signed   By: Jearld Lesch M.D.   On: 10/26/2023 11:47  Lab Results  Component Value Date   WBC 4.9 10/26/2023   HGB 6.6 (L) 10/26/2023   HCT 21.1 (L) 10/26/2023   MCV 89.0 10/26/2023   PLT 226 10/26/2023   Last metabolic panel Lab Results  Component Value Date   GLUCOSE 222 (H) 10/26/2023   NA 136 10/26/2023   K 4.8 10/26/2023   CL 102 10/26/2023   CO2 27 10/26/2023   BUN 24 (H) 10/26/2023   CREATININE 1.01 (H) 10/26/2023   GFRNONAA 56 (L) 10/26/2023   CALCIUM 8.5 (L) 10/26/2023   PHOS 3.8 06/30/2019   PROT 5.7 (L) 10/26/2023   ALBUMIN 3.5 10/26/2023   LABGLOB 2.2 01/03/2020   AGRATIO 2.0  01/03/2020   BILITOT 0.6 10/26/2023   ALKPHOS 39 10/26/2023   AST 17 10/26/2023   ALT 12 10/26/2023   ANIONGAP 7 10/26/2023    Assessment and Plan: * GIB (gastrointestinal bleeding) ABLA  + rectal bleeding x 1 week coming from local SNF  CT angio GI bleed with concern for hemorrhoidal or stercoral nidus of Hematochezia Per Dr.Smith, case discussed w/ Dr. Servando Snare who will be formally evaluating the patient  Hgb 6.6  Pending pRBC transfusion  Trend hgb  Transfuse for hgb <7  Follow up GI recommendations  Anemia panel   Atrial fibrillation (HCC) Baseline atrial fibrillation on eliquis  Hold in setting of rectal bleeding    Type 2 diabetes mellitus with hyperglycemia, with long-term current use of insulin (HCC) Blood sugar 200s  SSI  Monitor    Mild cognitive impairment Dementia  Baseline end stage dementia w/ cognitive impairment  + confusion at the bedside- unclear of baseline  Prn zyprexa for agitation  Monitor       Advance Care Planning:   Code Status: Full Code   Consults: GI   Family Communication: No family at the bedside   Severity of Illness: The appropriate patient status for this patient is INPATIENT. Inpatient status is judged to be reasonable and necessary in order to provide the required intensity of service to ensure the patient's safety. The patient's presenting symptoms, physical exam findings, and initial radiographic and laboratory data in the context of their chronic comorbidities is felt to place them at high risk for further clinical deterioration. Furthermore, it is not anticipated that the patient will be medically stable for discharge from the hospital within 2 midnights of admission.   * I certify that at the point of admission it is my clinical judgment that the patient will require inpatient hospital care spanning beyond 2 midnights from the point of admission due to high intensity of service, high risk for further deterioration and high  frequency of surveillance required.*  Author: Floydene Flock, MD 10/26/2023 1:39 PM  For on call review [email or web address].

## 2023-10-26 NOTE — Assessment & Plan Note (Signed)
 Blood sugar 200s SSI Monitor

## 2023-10-26 NOTE — Consult Note (Signed)
 Jenna Minium, MD [facility]  [address] [place], [place] [address] Phone: [phone] Fax : [phone]  Consultation  Referring Provider:     Dr. Katrinka Blazing Primary Care Physician:  Galvin Proffer, MD Primary Gastroenterologist: Gentry Fitz         Reason for Consultation:     Lower GI bleeding     Date of Admission:  10/26/2023 Date of Consultation:  10/26/2023         HPI:   Jenna Odom is a 83 y.o. female who was brought to the emergency department with a report of rectal bleeding and abdominal pain.  The patient was unable to give the emergency department physician much information.  She did have a CT angiography that showed:  IMPRESSION: 1. Small volume of contrast within the rectal vault on venous phase imaging, suggestive of hemorrhoidal or stercoral nidus of hematochezia. Sizable stool balls in the rectum. 2. Sigmoid diverticulosis without evidence of acute diverticulitis or specific evidence of diverticular hemorrhage. 3. Cholelithiasis without evidence of acute cholecystitis. 4. Coronary artery disease. 5. Severe aortic atherosclerosis  The patient's hemoglobin was found to be significantly low at 6.6 with her trends showing:  Component     Latest Ref Rng 08/23/2020 03/04/2023 10/26/2023  Hemoglobin     12.0 - 15.0 g/dL 13.0  86.5  6.6 (L)   HCT     36.0 - 46.0 % 41.3  38.8  21.1 (L)    It was reported that the patient took her last Eliquis this morning.  It appears that she takes this for atrial fibrillation.  The patient is not able to give any meaningful history and is being tended to by the nurses because she keeps getting out of bed and trying to leave the room.  She has also been pulling at her leads and her taking off her name tags.  Past Medical History:  Diagnosis Date   A-fib (HCC)    Anxiety    Dementia (HCC)    Diabetes mellitus without complication (HCC)    Hypertension     Past Surgical History:  Procedure Laterality Date    ABDOMINAL HYSTERECTOMY      Prior to Admission medications   Medication Sig Start Date End Date Taking? Authorizing Provider  Acetaminophen (TYLENOL PO) Take by mouth as needed.    [provider]  apixaban (ELIQUIS) 5 MG TABS tablet Take 1 tablet (5 mg total) by mouth 2 (two) times daily. cardio 06/30/19   Duanne Limerick, MD  busPIRone (BUSPAR) 5 MG tablet TAKE 1/2 (ONE-HALF) TABLET BY MOUTH IN THE MORNING AND 1 AT NIGHT 08/25/20   Duanne Limerick, MD  diclofenac Sodium (VOLTAREN) 1 % GEL Apply 2 g topically 4 (four) times daily. 04/12/20   Duanne Limerick, MD  Insulin Glargine (BASAGLAR KWIKPEN) 100 UNIT/ML Inject 0.12 mLs (12 Units total) into the skin daily. Patient taking differently: Inject 8 Units into the skin daily. 05/10/20   Duanne Limerick, MD  lisinopril (ZESTRIL) 2.5 MG tablet Take 1 tablet by mouth once daily 08/12/20   Duanne Limerick, MD  melatonin 3 MG TABS tablet Take 10 mg by mouth at bedtime.     [provider]  metFORMIN (GLUCOPHAGE) 1000 MG tablet Take 1 tablet (1,000 mg total) by mouth 2 (two) times daily with a meal. 05/10/20   Duanne Limerick, MD  metoprolol tartrate (LOPRESSOR) 25 MG tablet Take 1 tablet by mouth twice daily 07/16/20  Duanne Limerick, MD    Family History  Problem Relation Age of Onset   Dementia Mother    Other Father        gun shot     Social History   Tobacco Use   Smoking status: Never   Smokeless tobacco: Never  Vaping Use   Vaping status: Never Used  Substance Use Topics   Alcohol use: Never   Drug use: Never    Allergies as of 10/26/2023 - Review Complete 10/26/2023  Allergen Reaction Noted   Ciprofloxacin Nausea And Vomiting 02/28/2018    Review of Systems:    All systems reviewed and negative except where noted in HPI.   Physical Exam:  Vital signs in last 24 hours: Temp:  [97.9 F (36.6 C)] 97.9 F (36.6 C) (02/18 0930) Pulse Rate:  [55] 55 (02/18 0930) Resp:  [17] 17 (02/18 0930) BP: (113)/(60)  113/60 (02/18 0930) SpO2:  [99 %] 99 % (02/18 0930) Weight:  [64.9 kg] 64.9 kg (02/18 0931)   General:   Pleasant, not cooperative and somewhat combative in NAD Head:  Normocephalic and atraumatic. Eyes:   No icterus.   Conjunctiva pink. PERRLA. Ears:  Normal auditory acuity. Rectal:  Not performed. Msk:  Symmetrical without gross deformities.    Cervical Nodes:  No significant cervical adenopathy. Psych:  Alert and confused.  LAB RESULTS: Recent Labs    10/26/23 0932  WBC 4.9  HGB 6.6*  HCT 21.1*  PLT 226   BMET Recent Labs    10/26/23 0932  NA 136  K 4.8  CL 102  CO2 27  GLUCOSE 222*  BUN 24*  CREATININE 1.01*  CALCIUM 8.5*   LFT Recent Labs    10/26/23 0932  PROT 5.7*  ALBUMIN 3.5  AST 17  ALT 12  ALKPHOS 39  BILITOT 0.6   PT/INR No results for input(s): "LABPROT", "INR" in the last 72 hours.  STUDIES: CT ANGIO GI BLEED Result Date: 10/26/2023 CLINICAL DATA:  Lower abdominal pain, hematochezia, blood loss anemia requiring transfusion, rectal bleeding for 1 week EXAM: CTA ABDOMEN AND PELVIS WITHOUT AND WITH CONTRAST TECHNIQUE: Multidetector CT imaging of the abdomen and pelvis was performed using the standard protocol during bolus administration of intravenous contrast. Multiplanar reconstructed images and MIPs were obtained and reviewed to evaluate the vascular anatomy. RADIATION DOSE REDUCTION: This exam was performed according to the departmental dose-optimization program which includes automated exposure control, adjustment of the mA and/or kV according to patient size and/or use of iterative reconstruction technique. CONTRAST:  OMNIPAQUE IOHEXOL 350 MG/ML SOLN COMPARISON:  None Available. FINDINGS: VASCULAR Severe aortic atherosclerosis. Normal contour and caliber of the abdominal aorta. No evidence of aneurysm, dissection, or other acute aortic pathology. Standard branching pattern of the abdominal aorta with solitary bilateral renal arteries. Review  of the MIP images confirms the above findings. NON-VASCULAR Lower Chest: No acute findings.  Coronary artery calcifications. Hepatobiliary: No solid liver abnormality is seen. Tiny gallstones. No gallbladder wall thickening, or biliary dilatation. Pancreas: Unremarkable. No pancreatic ductal dilatation or surrounding inflammatory changes. Spleen: Normal in size without significant abnormality. Adrenals/Urinary Tract: Adrenal glands are unremarkable. Lobulated renal cortical scarring. Kidneys are otherwise normal, without renal calculi, solid lesion, or hydronephrosis. Bladder is unremarkable. Stomach/Bowel: Stomach is within normal limits. Large periampullary duodenal diverticulum hyperdense ingested material within the gastric lumen on precontrast images. Appendix not clearly visualized. No evidence of bowel wall thickening, distention, or inflammatory changes. Sigmoid diverticula. Small volume of contrast within  the rectal vault on venous phase imaging (series 17, image 199, 206). Sizable stool balls in the rectum. Lymphatic: No enlarged abdominal or pelvic lymph nodes. Reproductive: Status post hysterectomy. Other: No abdominal wall hernia or abnormality. No ascites. Musculoskeletal: No acute osseous findings. IMPRESSION: 1. Small volume of contrast within the rectal vault on venous phase imaging, suggestive of hemorrhoidal or stercoral nidus of hematochezia. Sizable stool balls in the rectum. 2. Sigmoid diverticulosis without evidence of acute diverticulitis or specific evidence of diverticular hemorrhage. 3. Cholelithiasis without evidence of acute cholecystitis. 4. Coronary artery disease. 5. Severe aortic atherosclerosis. Aortic Atherosclerosis (ICD10-I70.0). Electronically Signed   By: Jearld Lesch M.D.   On: 10/26/2023 11:47      Impression / Plan:   Assessment: Active Problems:   * No active hospital problems. *   Alesi Zachery is a 83 y.o. y/o female with rectal bleeding and a positive CT  scan angiography that shows diverticulosis without any evidence of diverticulitis with contrast in the rectal vault on venous phase suggestive of a hemorrhoidal or stercoral ulcer bleed.  There is also a sizable stool ball in the rectum.  I have spoken to the patient's daughter and have explained to her the low chance that the patient will be able to tolerate a prep for colonoscopy which would be recommended for this patient's rectal bleeding.  She has also been told that she may need to have an NG tube placed and her hands restraints that she has been pulling at her IVs and name tags and will likely take out the NG tube.  Plan:  I have messaged the hospitalist about the daughter's wishes and we will proceed to order a prep and colonoscopy for tomorrow.  The patient also has had her Eliquis held due to the rectal bleeding.  The patient's daughter has been explained the risks and benefits of the procedure and agrees with proceeding.  Thank you for involving me in the care of this patient.      LOS: 0 days   Jenna Minium, MD, MD. Clementeen Graham 10/26/2023, 12:23 PM,  Pager [phone] 7am-5pm  Check AMION for 5pm -7am coverage and on weekends   Note: This dictation was prepared with Dragon dictation along with smaller phrase technology. Any transcriptional errors that result from this process are unintentional.

## 2023-10-26 NOTE — ED Triage Notes (Addendum)
 First nurse note: pt to ED [facility] from [facility] for rectal bleeding x1 week, worsening today. Cbg 373, had insulin PTA 18g to Left arm, NS PTA. Dementia at baseline

## 2023-10-26 NOTE — ED Notes (Signed)
 Pt pulled out the 2nd iv  Additional meds given  Pt cleaned   dry brief applied

## 2023-10-26 NOTE — ED Notes (Signed)
 Meds given to calm pt  Additional line started   Placed placed back in bed

## 2023-10-26 NOTE — ED Notes (Signed)
 Pt incontinent of stool at this time. Pt cleaned with peri wipes, placed into a clean brief, bed linen changed, this tech 1:1 sitter for safety.

## 2023-10-26 NOTE — ED Notes (Signed)
 76f/salem sump placed in right nare  Tolerated well

## 2023-10-26 NOTE — ED Notes (Signed)
 Pt has pulled out her iv  Put cleaned up and had new iv placed

## 2023-10-26 NOTE — ED Notes (Signed)
 See triage note  Presents from [facility]  Per the staff she has had a some rectal bleeding  for about 1 week

## 2023-10-26 NOTE — Assessment & Plan Note (Signed)
 Baseline atrial fibrillation on eliquis  Hold in setting of rectal bleeding

## 2023-10-26 NOTE — Assessment & Plan Note (Signed)
 Dementia  Baseline end stage dementia w/ cognitive impairment  + confusion at the bedside- unclear of baseline  Prn zyprexa for agitation  Monitor

## 2023-10-27 ENCOUNTER — Inpatient Hospital Stay: Payer: Medicare HMO | Admitting: Anesthesiology

## 2023-10-27 ENCOUNTER — Encounter
Admission: EM | Disposition: A | Discharge status: Home / Self Care | Payer: Self-pay | Source: Skilled Nursing Facility | Attending: Internal Medicine

## 2023-10-27 DIAGNOSIS — K5730 Diverticulosis of large intestine without perforation or abscess without bleeding: Secondary | ICD-10-CM | POA: Diagnosis not present

## 2023-10-27 DIAGNOSIS — K641 Second degree hemorrhoids: Secondary | ICD-10-CM | POA: Diagnosis not present

## 2023-10-27 DIAGNOSIS — K922 Gastrointestinal hemorrhage, unspecified: Secondary | ICD-10-CM | POA: Diagnosis not present

## 2023-10-27 HISTORY — PX: COLONOSCOPY WITH PROPOFOL: SHX5780

## 2023-10-27 LAB — COMPREHENSIVE METABOLIC PANEL
ALT: 18 U/L (ref 0–44)
AST: 38 U/L (ref 15–41)
Albumin: 3.4 g/dL — ABNORMAL LOW (ref 3.5–5.0)
Alkaline Phosphatase: 36 U/L — ABNORMAL LOW (ref 38–126)
Anion gap: 8 (ref 5–15)
BUN: 19 mg/dL (ref 8–23)
CO2: 24 mmol/L (ref 22–32)
Calcium: 8.1 mg/dL — ABNORMAL LOW (ref 8.9–10.3)
Chloride: 103 mmol/L (ref 98–111)
Creatinine, Ser: 0.92 mg/dL (ref 0.44–1.00)
GFR, Estimated: >60 mL/min (ref >60)
Glucose, Bld: 137 mg/dL — ABNORMAL HIGH (ref 70–99)
Potassium: 3.7 mmol/L (ref 3.5–5.1)
Sodium: 135 mmol/L (ref 135–145)
Total Bilirubin: 1.2 mg/dL (ref 0.0–1.2)
Total Protein: 5.5 g/dL — ABNORMAL LOW (ref 6.5–8.1)

## 2023-10-27 LAB — HEMOGLOBIN A1C
Hgb A1c MFr Bld: 6.2 % — ABNORMAL HIGH (ref 4.8–5.6)
Mean Plasma Glucose: 131.24 mg/dL

## 2023-10-27 LAB — HEMOGLOBIN AND HEMATOCRIT, BLOOD
HCT: 23.8 % — ABNORMAL LOW (ref 36.0–46.0)
HCT: 22.4 % — ABNORMAL LOW (ref 36.0–46.0)
HCT: 23.2 % — ABNORMAL LOW (ref 36.0–46.0)
Hemoglobin: 7.6 g/dL — ABNORMAL LOW (ref 12.0–15.0)
Hemoglobin: 7.1 g/dL — ABNORMAL LOW (ref 12.0–15.0)
Hemoglobin: 7.5 g/dL — ABNORMAL LOW (ref 12.0–15.0)

## 2023-10-27 LAB — IRON AND TIBC
Iron: 57 ug/dL (ref 28–170)
Saturation Ratios: 16 % (ref 10.4–31.8)
TIBC: 360 ug/dL (ref 250–450)
UIBC: 303 ug/dL

## 2023-10-27 LAB — MAGNESIUM: Magnesium: 1.7 mg/dL (ref 1.7–2.4)

## 2023-10-27 LAB — CBG MONITORING, ED
Glucose-Capillary: 123 mg/dL — ABNORMAL HIGH (ref 70–99)
Glucose-Capillary: 104 mg/dL — ABNORMAL HIGH (ref 70–99)
Glucose-Capillary: 107 mg/dL — ABNORMAL HIGH (ref 70–99)

## 2023-10-27 LAB — GLUCOSE, CAPILLARY
Glucose-Capillary: 120 mg/dL — ABNORMAL HIGH (ref 70–99)
Glucose-Capillary: 221 mg/dL — ABNORMAL HIGH (ref 70–99)

## 2023-10-27 LAB — FOLATE: Folate: 14.9 ng/mL (ref >5.9)

## 2023-10-27 LAB — VITAMIN D 25 HYDROXY (VIT D DEFICIENCY, FRACTURES): Vit D, 25-Hydroxy: 55.36 ng/mL (ref 30–100)

## 2023-10-27 LAB — VITAMIN B12: Vitamin B-12: 155 pg/mL — ABNORMAL LOW (ref 180–914)

## 2023-10-27 LAB — PREPARE RBC (CROSSMATCH)

## 2023-10-27 LAB — PHOSPHORUS: Phosphorus: 3.1 mg/dL (ref 2.5–4.6)

## 2023-10-27 SURGERY — COLONOSCOPY WITH PROPOFOL
Anesthesia: General

## 2023-10-27 MED ORDER — DEXTROSE 50 % IV SOLN: 12.5000 g | INTRAVENOUS | Status: DC | PRN

## 2023-10-27 MED ORDER — PROPOFOL 500 MG/50ML IV EMUL
INTRAVENOUS | Status: DC | PRN
  Administered 2023-10-27: 165 ug/kg/min via INTRAVENOUS

## 2023-10-27 MED ORDER — OLANZAPINE 10 MG IM SOLR
5.0000 mg | Freq: Once | INTRAMUSCULAR | Status: AC
  Administered 2023-10-27: 5 mg via INTRAMUSCULAR
  Filled 2023-10-27: qty 10

## 2023-10-27 MED ORDER — PROPOFOL 10 MG/ML IV BOLUS
INTRAVENOUS | Status: DC | PRN
  Administered 2023-10-27: 50 mg via INTRAVENOUS
  Administered 2023-10-27: 10 mg via INTRAVENOUS

## 2023-10-27 MED ORDER — PANTOPRAZOLE SODIUM 40 MG IV SOLR
40.0000 mg | Freq: Two times a day (BID) | INTRAVENOUS | Status: DC
  Administered 2023-10-27 – 2023-10-28 (×2): 40 mg via INTRAVENOUS
  Filled 2023-10-27 (×3): qty 10

## 2023-10-27 MED ORDER — ONDANSETRON HCL 4 MG/2ML IJ SOLN: 4.0000 mg | Freq: Four times a day (QID) | INTRAMUSCULAR | Status: DC | PRN

## 2023-10-27 MED ORDER — ACETAMINOPHEN 325 MG PO TABS
650.0000 mg | ORAL_TABLET | Freq: Four times a day (QID) | ORAL | Status: DC | PRN
Start: 2023-10-27 — End: 2023-11-03
  Administered 2023-10-31: 650 mg via ORAL
  Filled 2023-10-27: qty 2

## 2023-10-27 MED ORDER — SODIUM CHLORIDE 0.9% IV SOLUTION: Freq: Once | INTRAVENOUS | Status: AC

## 2023-10-27 MED ORDER — LIDOCAINE HCL (CARDIAC) PF 100 MG/5ML IV SOSY
PREFILLED_SYRINGE | INTRAVENOUS | Status: DC | PRN
  Administered 2023-10-27: 100 mg via INTRAVENOUS

## 2023-10-27 MED ORDER — DEXTROSE 50 % IV SOLN: 12.5000 g | INTRAVENOUS | Status: DC

## 2023-10-27 MED ORDER — QUETIAPINE FUMARATE 25 MG PO TABS
25.0000 mg | ORAL_TABLET | Freq: Two times a day (BID) | ORAL | Status: DC
  Administered 2023-10-27 – 2023-11-03 (×12): 25 mg via ORAL
  Filled 2023-10-27 (×14): qty 1

## 2023-10-27 MED ORDER — GERHARDT'S BUTT CREAM
TOPICAL_CREAM | Freq: Every day | CUTANEOUS | Status: DC
  Administered 2023-10-31: 1 via TOPICAL
  Filled 2023-10-27: qty 60

## 2023-10-27 NOTE — ED Notes (Signed)
 Report given to endo for EGD/colonoscopy

## 2023-10-27 NOTE — ED Notes (Signed)
 Pt was wet from brief. Brief changed, no stool in brief to assess consistency. MD notified, will attempt to DC restraints when NGT is removed once stool is clear consistency for EGD/colonoscopy

## 2023-10-27 NOTE — Anesthesia Preprocedure Evaluation (Signed)
 Anesthesia Evaluation  Patient identified by MRN, date of birth, ID band Patient awake and Patient confused    Reviewed: Allergy & Precautions, NPO status , Patient's Chart, lab work & pertinent test results  Airway Mallampati: II  TM Distance: >3 FB Neck ROM: full    Dental  (+) Edentulous Upper, Poor Dentition, Missing   Pulmonary neg pulmonary ROS   Pulmonary exam normal breath sounds clear to auscultation       Cardiovascular Exercise Tolerance: Poor hypertension, Pt. on medications negative cardio ROS Normal cardiovascular exam+ dysrhythmias Atrial Fibrillation  Rhythm:Regular Rate:Normal     Neuro/Psych   Anxiety    Dementia negative neurological ROS  negative psych ROS   GI/Hepatic negative GI ROS, Neg liver ROS,,,  Endo/Other  negative endocrine ROSdiabetes, Well Controlled, Type 1    Renal/GU negative Renal ROS  negative genitourinary   Musculoskeletal   Abdominal Normal abdominal exam  (+)   Peds  Hematology negative hematology ROS (+) Blood dyscrasia, anemia   Anesthesia Other Findings Past Medical History: No date: A-fib (HCC) No date: Anxiety No date: Dementia (HCC) No date: Diabetes mellitus without complication (HCC) No date: Hypertension  Past Surgical History: No date: ABDOMINAL HYSTERECTOMY  BMI    Body Mass Index: 25.35 kg/m      Reproductive/Obstetrics negative OB ROS                             Anesthesia Physical Anesthesia Plan  ASA: 3  Anesthesia Plan: General   Post-op Pain Management:    Induction: Intravenous  PONV Risk Score and Plan: Propofol infusion and TIVA  Airway Management Planned: Natural Airway and Nasal Cannula  Additional Equipment:   Intra-op Plan:   Post-operative Plan:   Informed Consent: I have reviewed the patients History and Physical, chart, labs and discussed the procedure including the risks, benefits and  alternatives for the proposed anesthesia with the patient or authorized representative who has indicated his/her understanding and acceptance.     Dental Advisory Given  Plan Discussed with: CRNA  Anesthesia Plan Comments:        Anesthesia Quick Evaluation

## 2023-10-27 NOTE — Transfer of Care (Signed)
 Immediate Anesthesia Transfer of Care Note  Patient: Jenna Odom  Procedure(s) Performed: COLONOSCOPY WITH PROPOFOL  Patient Location: PACU and Endoscopy Unit  Anesthesia Type:General  Level of Consciousness: drowsy and patient cooperative  Airway & Oxygen Therapy: Patient Spontanous Breathing and Patient connected to face mask oxygen  Post-op Assessment: Report given to RN and Post -op Vital signs reviewed and stable  Post vital signs: Reviewed and stable  Last Vitals:  Vitals Value Taken Time   92/53 10/27/23 1207  Temp 36.3 C 10/27/23 1207  Pulse 88 10/27/23 1207  Resp 16 10/27/23 1207  SpO2 99 % 10/27/23 1207    Last Pain:  Vitals:   10/27/23 1207  TempSrc: Temporal  PainSc:          Complications: No notable events documented.

## 2023-10-27 NOTE — ED Notes (Addendum)
 Notified Jawo NP of hemoglobin at 0130 at 7.6 now with slight drop from 8.1 post blood transfusion. Patient has had several large bowel movements from receiving bowel prep. No orders at this time.

## 2023-10-27 NOTE — Op Note (Signed)
 [facility] Patient Name: Jenna Odom Procedure Date: 10/27/2023 11:23 AM MRN: [id number] Account #: [id number] Date of Birth: June 16, 1941 Admit Type: Inpatient Age: 83 Room: [facility] ENDO ROOM 4 Gender: Female Note Status: Finalized Instrument Name: Prentice Docker [id number] Procedure:             Colonoscopy Indications:           Hematochezia Providers:             [name] MD, MD Referring MD:          Galvin Proffer, MD Medicines:             Propofol per Anesthesia Complications:         No immediate complications. Procedure:             Pre-Anesthesia Assessment:                        - Prior to the procedure, a History and Physical was                         performed, and patient medications and allergies were                         reviewed. The patient's tolerance of previous                         anesthesia was also reviewed. The risks and benefits                         of the procedure and the sedation options and risks                         were discussed with the patient. All questions were                         answered, and informed consent was obtained. Prior                         Anticoagulants: The patient has taken no anticoagulant                         or antiplatelet agents. ASA Grade Assessment: II - A                         patient with mild systemic disease. After reviewing                         the risks and benefits, the patient was deemed in                         satisfactory condition to undergo the procedure.                        After obtaining informed consent, the colonoscope was                         passed under direct vision. Throughout the procedure,  the patient's blood pressure, pulse, and oxygen                         saturations were monitored continuously. The                         Colonoscope was introduced through the anus and                          advanced to the the cecum, identified by appendiceal                         orifice and ileocecal valve. The colonoscopy was                         performed without difficulty. The patient tolerated                         the procedure well. The quality of the bowel                         preparation was excellent. Findings:      The perianal and digital rectal examinations were normal.      Multiple small-mouthed diverticula were found in the sigmoid colon.      Non-bleeding internal hemorrhoids were found during retroflexion. The       hemorrhoids were Grade II (internal hemorrhoids that prolapse but reduce       spontaneously).      No old or fresh blood seen. Impression:            - Diverticulosis in the sigmoid colon.                        - Non-bleeding internal hemorrhoids.                        - No old or fresh blood seen.                        - No specimens collected. Recommendation:        - Return patient to hospital ward for ongoing care.                        - Resume previous diet.                        - Continue present medications.                        - If any further GI bleeding then consider surgery for                         treatment of hemorrhoids. Procedure Code(s):     --- Professional ---                        [phone], Colonoscopy, flexible; diagnostic, including                         collection of specimen(s) by brushing or washing, when  performed (separate procedure) Diagnosis Code(s):     --- Professional ---                        K92.1, Melena (includes Hematochezia) CPT copyright 2022 American Medical Association. All rights reserved. The codes documented in this report are preliminary and upon coder review may  be revised to meet current compliance requirements. [name] MD, MD 10/27/2023 40:98:11 PM This report has been signed electronically. Number of Addenda: 0 Note Initiated On: 10/27/2023 11:23 AM Scope  Withdrawal Time: 0 hours 5 minutes 14 seconds  Total Procedure Duration: 0 hours 12 minutes 59 seconds  Estimated Blood Loss:  Estimated blood loss: none.      [facility]

## 2023-10-27 NOTE — ED Notes (Signed)
 Pt incontinent of stool at this time. This EDT and pts RN changed pt. Peri wipes used and peri care performed. New chucks and brief placed on pt. During change this EDT and RN noticed bleeding in pts stool. Pt now resting comfortably in bed and no further needs at this time. 1:1 sitter in room for pts safety.

## 2023-10-27 NOTE — ED Notes (Signed)
 Pt had a BM, all water but still slightly brown, Endo nurse @bedside  and aware. Pt was changed, new brief placed. NGT removed at this time

## 2023-10-27 NOTE — Progress Notes (Signed)
 The patient underwent a colonoscopy without any fresh blood or old blood seen throughout the colon.  There was some diverticulosis seen and enlarged hemorrhoids.  With the findings on the colonoscopy and findings on the CT angiography this was likely a hemorrhoidal bleed.  The patient has a stable hemoglobin.  If the patient should have any further bleeding then surgery should consider treatment of her hemorrhoids.  Nothing further to do from a GI point of view.  I have called the patient's daughter and explained to her my findings.  I will sign off.  Please call if any further GI concerns or questions.  We would like to thank you for the opportunity to participate in the care of [facility].

## 2023-10-27 NOTE — Plan of Care (Signed)
  Problem: Education: Goal: Ability to describe self-care measures that may prevent or decrease complications (Diabetes Survival Skills Education) will improve Outcome: Progressing Goal: Individualized Educational Video(s) Outcome: Progressing   Problem: Coping: Goal: Ability to adjust to condition or change in health will improve Outcome: Progressing   Problem: Fluid Volume: Goal: Ability to maintain a balanced intake and output will improve Outcome: Progressing   Problem: Health Behavior/Discharge Planning: Goal: Ability to identify and utilize available resources and services will improve Outcome: Progressing Goal: Ability to manage health-related needs will improve Outcome: Progressing   Problem: Metabolic: Goal: Ability to maintain appropriate glucose levels will improve Outcome: Progressing   Problem: Nutritional: Goal: Maintenance of adequate nutrition will improve Outcome: Progressing Goal: Progress toward achieving an optimal weight will improve Outcome: Progressing   Problem: Skin Integrity: Goal: Risk for impaired skin integrity will decrease Outcome: Progressing   Problem: Tissue Perfusion: Goal: Adequacy of tissue perfusion will improve Outcome: Progressing   Problem: Safety: Goal: Non-violent Restraint(s) Outcome: Progressing   Problem: Education: Goal: Knowledge of General Education information will improve Description: Including pain rating scale, medication(s)/side effects and non-pharmacologic comfort measures Outcome: Progressing   Problem: Health Behavior/Discharge Planning: Goal: Ability to manage health-related needs will improve Outcome: Progressing   Problem: Clinical Measurements: Goal: Ability to maintain clinical measurements within normal limits will improve Outcome: Progressing Goal: Will remain free from infection Outcome: Progressing Goal: Diagnostic test results will improve Outcome: Progressing Goal: Respiratory complications  will improve Outcome: Progressing Goal: Cardiovascular complication will be avoided Outcome: Progressing   Problem: Activity: Goal: Risk for activity intolerance will decrease Outcome: Progressing   Problem: Nutrition: Goal: Adequate nutrition will be maintained Outcome: Progressing   Problem: Coping: Goal: Level of anxiety will decrease Outcome: Progressing   Problem: Elimination: Goal: Will not experience complications related to bowel motility Outcome: Progressing Goal: Will not experience complications related to urinary retention Outcome: Progressing   Problem: Pain Managment: Goal: General experience of comfort will improve and/or be controlled Outcome: Progressing   Problem: Safety: Goal: Ability to remain free from injury will improve Outcome: Progressing   Problem: Skin Integrity: Goal: Risk for impaired skin integrity will decrease Outcome: Progressing

## 2023-10-27 NOTE — Anesthesia Postprocedure Evaluation (Signed)
 Anesthesia Post Note  Patient: Jenna Odom  Procedure(s) Performed: COLONOSCOPY WITH PROPOFOL  Patient location during evaluation: PACU Anesthesia Type: General Level of consciousness: sedated Pain management: pain level controlled Vital Signs Assessment: post-procedure vital signs reviewed and stable Respiratory status: spontaneous breathing Cardiovascular status: blood pressure returned to baseline Anesthetic complications: no   No notable events documented.   Last Vitals:  Vitals:   10/27/23 1220 10/27/23 1233  BP: (!) 90/53 (!) 96/55  Pulse: 87 87  Resp:  16  Temp:    SpO2: 100% 99%    Last Pain:  Vitals:   10/27/23 1207  TempSrc: Temporal  PainSc:                  VAN STAVEREN,Jenna Odom

## 2023-10-27 NOTE — Progress Notes (Signed)
 [facility] Progress Note  Patient: Jenna Odom    MVH:[id number]  DOA: 10/26/2023     Date of Service: the patient was seen and examined on 10/27/2023  Chief Complaint  Patient presents with   Rectal Bleeding   Abdominal Pain   Brief hospital course: Meg Niemeier is a 83 y.o. female with medical history significant of dementia, atrial fibrillation, type 2 diabetes, hypertension presented with rectal bleeding.  Limited history in the setting of dementia and cognitive impairment.  Patient noted of had rectal bleeding at local skilled nursing facility.  Has been present for roughly 1 week.  No reports of recent medication changes.  No reports of recent fall or trauma.  No reported chest pain, shortness of breath, nausea or vomiting.  On Eliquis in setting of atrial fibrillation.  Unclear if this has been stopped at local skilled nursing facility.  Presented to the ER afebrile, hemodynamically stable.  Satting well on room air.  White count 4.9, hemoglobin 6.6, platelets 226, creatinine 1.1, glucose 220s.  CT angio GI bleed with changes concerning for hemorrhoidal versus stercoral nidus of hematochezia.  Noted sizable stool balls in the rectum.  Positive diverticulosis without diverticulitis or evidence of diverticular hemorrhage.Dr. Servando Snare w/ GI consulted.    Assessment and Plan: # GIB (gastrointestinal bleeding) most likely due to internal hemorrhoids # ABLA  + rectal bleeding x 1 week coming from local SNF  CT angio GI bleed with concern for hemorrhoidal or stercoral nidus of Hematochezia, Hgb 6.6  S/p 1 unit PRBC transfusion 2/19 Hb 7.1 borderline low, will transfuse 1 unit of PRBC to keep Hb above 8 Trend hgb, Transfuse for hgb <7  GI consulted, s/p colonoscopy, found to have internal hemorrhoids, no active bleeding.  GI recommended general surgery consult for hemorrhoidal management if recurrent bleeding. 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  Interventions:  Diet: Carb modified diet DVT Prophylaxis: SCD, pharmacological prophylaxis contraindicated due to GI bleeding    Advance goals of care discussion: Full code  Family Communication: family was not present at bedside, at the time of interview.  Patient has end-stage dementia, AO x 1  Disposition:  Pt is from SNF, admitted with GI bleeding, s/p colonoscopy done on 2/19, which precludes a safe discharge. Discharge to SNF, when when stable, most likely tomorrow a.m.  Subjective: No significant events overnight, patient was laying comfortably in the ED, AO x 1, unable to offer any complaints, significant dementia.  Physical Exam: General: NAD, lying comfortably Appear in no distress, affect appropriate Eyes: PERRLA ENT: Oral Mucosa Clear, moist  Neck: no JVD,  Cardiovascular: S1 and S2 Present, no Murmur,  Respiratory: good respiratory effort, Bilateral Air entry equal and Decreased, no Crackles, no wheezes Abdomen: Bowel Sound present, Soft and no tenderness,  Skin: no rashes Extremities: no Pedal edema, no calf tenderness Neurologic: without any new focal findings Gait not checked due to patient safety concerns  Vitals:   10/27/23 1220 10/27/23 1233 10/27/23 1247 10/27/23 1340  BP: (!) 90/53 (!) 96/55 119/66  130/60  Pulse: 87 87 87   Resp:  16    Temp:      TempSrc:      SpO2: 100% 99% 100%   Weight:      Height:        Intake/Output Summary (Last 24 hours) at 10/27/2023 1519 Last data filed at 10/27/2023 1337 Gross per 24 hour  Intake 1368 ml  Output 220  ml  Net 1148 ml   Filed Weights   10/26/23 0931  Weight: 64.9 kg    Data Reviewed: I have personally reviewed and interpreted daily labs, tele strips, imagings as discussed above. I reviewed all nursing notes, pharmacy notes, vitals, pertinent old records I have discussed plan of care as described above with RN and patient/family.  CBC: Recent Labs  Lab 10/26/23 0932 10/26/23 2126 10/27/23 0133 10/27/23 0744  WBC 4.9  --   --   --   HGB 6.6* 8.1* 7.6* 7.1*  HCT 21.1* 25.5* 23.8* 22.4*  MCV 89.0  --   --   --   PLT 226  --   --   --    Basic Metabolic Panel: Recent Labs  Lab 10/26/23 0932 10/27/23 0406  NA 136 135  K 4.8 3.7  CL 102 103  CO2 27 24  GLUCOSE 222* 137*  BUN 24* 19  CREATININE 1.01* 0.92  CALCIUM 8.5* 8.1*  MG  --  1.7  PHOS  --  3.1    Studies: DG Abdomen 1 View Result Date: 10/26/2023 CLINICAL DATA:  NG tube repositioning. EXAM: ABDOMEN - 1 VIEW COMPARISON:  Earlier today FINDINGS: Tip and side port of the enteric tube are now located below the diaphragm in the stomach. Nonobstructive upper abdominal bowel gas pattern. IMPRESSION: Tip and side port of the enteric tube are now located below the diaphragm in the stomach. Electronically Signed   By: Narda Rutherford M.D.   On: 10/26/2023 22:00   DG Abdomen 1 View Result Date: 10/26/2023 CLINICAL DATA:  NG tube placement. Patient pulled out NG tube, subsequently replaced. EXAM: ABDOMEN - 1 VIEW COMPARISON:  Earlier today.  Subsequent radiograph also reviewed. FINDINGS: The enteric tube is looped in the distal esophagus, tip directed cranially not included in the field of view. This has been subsequently adjusted. No bowel dilatation in the upper abdomen. IMPRESSION: Enteric tube looped in the distal esophagus, tip directed cranially not included in the field of view. This has been subsequently adjusted. Electronically Signed   By: Narda Rutherford M.D.   On: 10/26/2023 21:57   DG Abdomen 1 View Result  Date: 10/26/2023 CLINICAL DATA:  NG placement. EXAM: ABDOMEN - 1 VIEW COMPARISON:  CT abdomen pelvis dated 10/26/2023. FINDINGS: Enteric tube with side port and tip in the left upper abdomen in the proximal stomach. No bowel dilatation or evidence of obstruction. Excreted contrast noted in the urinary bladder. Osteopenia with degenerative changes of spine. No acute osseous pathology. IMPRESSION: Enteric tube with tip in the proximal stomach. Electronically Signed   By: Elgie Collard M.D.   On: 10/26/2023 19:11    Scheduled Meds:  [MAR Hold] Gerhardt's butt cream   Topical Daily   [MAR Hold] insulin aspart  0-9 Units Subcutaneous Q4H   [MAR Hold] pantoprazole (PROTONIX) IV  40 mg Intravenous Q12H   Continuous Infusions:  sodium chloride 75 mL/hr at 10/27/23 0132   PRN Meds: [MAR Hold] dextrose, [MAR Hold] haloperidol lactate  Time spent: 55 minutes  Author: Gillis Santa. MD  [facility] 10/27/2023 3:19 PM  To reach On-call, see care teams to locate the attending and reach out to them via [email or web address]. If 7PM-7AM, please contact night-coverage If you still have difficulty reaching the attending provider, please page the [facility] (Director on Call) for [facility] on amion for assistance.

## 2023-10-27 NOTE — ED Notes (Signed)
 Secure chat with Geradine Girt NP in regards to inquiring about possible use of a flexi seal due to repeated/constant bowel movements by patient as a result of the bowel prep and patient's skin becoming irritated. Flexi seal contraindicated at this time due to questionable GI bleed.

## 2023-10-27 NOTE — ED Notes (Signed)
 This NT and nurse Emilee changed pt after she had a BM. Pt has on a clean brief and a clean chux.

## 2023-10-27 NOTE — ED Notes (Addendum)
 Pt inconnient of stool at this time.

## 2023-10-27 NOTE — ED Notes (Addendum)
 This tech and Technical sales engineer changed pt and placed pt in new brief after urinary and stool incontinence. Pt is currently resting, with this tech 1:1 sitter for safety.

## 2023-10-27 NOTE — ED Notes (Signed)
 Restraints Dc'ed at this time

## 2023-10-28 ENCOUNTER — Encounter: Payer: Self-pay | Admitting: Gastroenterology

## 2023-10-28 DIAGNOSIS — K922 Gastrointestinal hemorrhage, unspecified: Secondary | ICD-10-CM | POA: Diagnosis not present

## 2023-10-28 LAB — GLUCOSE, CAPILLARY
Glucose-Capillary: 163 mg/dL — ABNORMAL HIGH (ref 70–99)
Glucose-Capillary: 167 mg/dL — ABNORMAL HIGH (ref 70–99)
Glucose-Capillary: 180 mg/dL — ABNORMAL HIGH (ref 70–99)
Glucose-Capillary: 152 mg/dL — ABNORMAL HIGH (ref 70–99)
Glucose-Capillary: 103 mg/dL — ABNORMAL HIGH (ref 70–99)

## 2023-10-28 LAB — CBC
HCT: 25.6 % — ABNORMAL LOW (ref 36.0–46.0)
Hemoglobin: 8.4 g/dL — ABNORMAL LOW (ref 12.0–15.0)
MCH: 27.6 pg (ref 26.0–34.0)
MCHC: 32.8 g/dL (ref 30.0–36.0)
MCV: 84.2 fL (ref 80.0–100.0)
Platelets: 202 10*3/uL (ref 150–400)
RBC: 3.04 MIL/uL — ABNORMAL LOW (ref 3.87–5.11)
RDW: 14.7 % (ref 11.5–15.5)
WBC: 9.3 10*3/uL (ref 4.0–10.5)
nRBC: 0 % (ref 0.0–0.2)

## 2023-10-28 LAB — MAGNESIUM: Magnesium: 1.6 mg/dL — ABNORMAL LOW (ref 1.7–2.4)

## 2023-10-28 LAB — BASIC METABOLIC PANEL
Anion gap: 8 (ref 5–15)
BUN: 15 mg/dL (ref 8–23)
CO2: 26 mmol/L (ref 22–32)
Calcium: 8.7 mg/dL — ABNORMAL LOW (ref 8.9–10.3)
Chloride: 107 mmol/L (ref 98–111)
Creatinine, Ser: 1.08 mg/dL — ABNORMAL HIGH (ref 0.44–1.00)
GFR, Estimated: 51 mL/min — ABNORMAL LOW (ref >60)
Glucose, Bld: 165 mg/dL — ABNORMAL HIGH (ref 70–99)
Potassium: 4.1 mmol/L (ref 3.5–5.1)
Sodium: 141 mmol/L (ref 135–145)

## 2023-10-28 LAB — HEMOGLOBIN AND HEMATOCRIT, BLOOD
HCT: 26.7 % — ABNORMAL LOW (ref 36.0–46.0)
Hemoglobin: 8.7 g/dL — ABNORMAL LOW (ref 12.0–15.0)

## 2023-10-28 LAB — PHOSPHORUS: Phosphorus: 3.5 mg/dL (ref 2.5–4.6)

## 2023-10-28 MED ORDER — CYANOCOBALAMIN 1000 MCG/ML IJ SOLN
1000.0000 ug | Freq: Every day | INTRAMUSCULAR | Status: DC
  Administered 2023-10-29 – 2023-11-02 (×5): 1000 ug via INTRAMUSCULAR
  Filled 2023-10-28 (×7): qty 1

## 2023-10-28 MED ORDER — PANTOPRAZOLE SODIUM 40 MG PO TBEC
40.0000 mg | DELAYED_RELEASE_TABLET | Freq: Every day | ORAL | Status: DC
  Administered 2023-10-29 – 2023-11-03 (×5): 40 mg via ORAL
  Filled 2023-10-28 (×5): qty 1

## 2023-10-28 MED ORDER — VITAMIN B-12 1000 MCG PO TABS: 1000.0000 ug | ORAL_TABLET | Freq: Every day | ORAL | Status: DC

## 2023-10-28 MED ORDER — APIXABAN 2.5 MG PO TABS
2.5000 mg | ORAL_TABLET | Freq: Two times a day (BID) | ORAL | Status: DC
  Administered 2023-10-28 – 2023-10-30 (×4): 2.5 mg via ORAL
  Filled 2023-10-28 (×4): qty 1

## 2023-10-28 MED ORDER — MAGNESIUM SULFATE 2 GM/50ML IV SOLN
2.0000 g | Freq: Once | INTRAVENOUS | Status: AC
  Administered 2023-10-28: 2 g via INTRAVENOUS
  Filled 2023-10-28: qty 50

## 2023-10-28 MED ORDER — SODIUM CHLORIDE 0.9 % IV BOLUS
500.0000 mL | Freq: Once | INTRAVENOUS | Status: DC
Start: 2023-10-28 — End: 2023-10-29

## 2023-10-28 MED ORDER — MIDODRINE HCL 5 MG PO TABS
10.0000 mg | ORAL_TABLET | Freq: Three times a day (TID) | ORAL | Status: DC
  Administered 2023-10-28 (×2): 10 mg via ORAL
  Filled 2023-10-28 (×3): qty 2

## 2023-10-28 NOTE — TOC Initial Note (Signed)
 Transition of Care [facility]) - Initial/Assessment Note    Patient Details  Name: Jenna Odom MRN: [id number] Date of Birth: 1941-02-18  [facility] CM/SW Contact:    Garret Reddish, RN Phone Number: 10/28/2023, 4:58 PM  Clinical Narrative:                 Chart reviewed.  Patient was admitted for GI bleed/Rectal Bleeding.  GI has been consulted to see patient.    I have spoken with Tammy, Admission Coordinator with [facility].  Tammy reports that prior to admission patient was a resident of [facility].  Tammy reports that patient was able to get around without any assistive devices.  Tammy reports that patient was pleasantly confused at the facility.  Tammy reports that her discharge the facility may be able to pick up patient up.  Tammy reports that patient is able to return on discharge.  Tammy contact number is [phone].   TOC will continue to follow for discharge planning.    Expected Discharge Plan: Assisted Living Barriers to Discharge: No Barriers Identified   Patient Goals and CMS Choice   CMS Medicare.gov Compare Post Acute Care list provided to:: Patient Choice offered to / list presented to : Patient      Expected Discharge Plan and Services   Discharge Planning Services: CM Consult   Living arrangements for the past 2 months: Assisted Living Facility                                      Prior Living Arrangements/Services Living arrangements for the past 2 months: Assisted Living Facility   Patient language and need for interpreter reviewed:: Yes                 Activities of Daily Living      Permission Sought/Granted                  Emotional Assessment       Orientation: : Oriented to Self      Admission diagnosis:  Blood loss anemia [D50.0] GIB (gastrointestinal bleeding) [K92.2] Anticoagulated [Z79.01] Gastrointestinal hemorrhage, unspecified gastrointestinal hemorrhage  type [K92.2] Patient Active Problem List   Diagnosis Date Noted   GIB (gastrointestinal bleeding) 10/26/2023   Atrial fibrillation (HCC) 10/26/2023   Blood loss anemia 10/26/2023   Benign essential hypertension 02/18/2020   Type 2 diabetes mellitus with hyperglycemia, with long-term current use of insulin (HCC) 02/28/2019   Mild cognitive impairment 04/06/2018   PCP:  Galvin Proffer, MD Pharmacy:   [facility] [address] [place] - [address] [place] [address] [address] [place] [address] [place] [address] Phone: [phone] Fax: [phone]  [facility] [address], [place] - [address] [place] [address] Phone: [phone] Fax: [phone]     Social Drivers of Health (SDOH) Social History: SDOH Screenings   Food Insecurity: No Food Insecurity (05/29/2020)  Housing: Low Risk  (05/29/2020)  Transportation Needs: No Transportation Needs (05/29/2020)  Depression (PHQ2-9): Low Risk  (05/29/2020)  Financial Resource Strain: Low Risk  (05/29/2020)  Physical Activity: Inactive (05/29/2020)  Social Connections: Moderately Isolated (05/29/2020)  Stress: No Stress Concern Present (05/29/2020)  Tobacco Use: Low Risk  (10/26/2023)   SDOH Interventions:     Readmission Risk Interventions     No data to display

## 2023-10-28 NOTE — Progress Notes (Signed)
 Pt given haldolx2 and zyprexa x1. Pt unredirectable. Fighting and swinging. Unable to get fs r/t pt fighting. Pt cont to try to get out bed and not following commands. This rn in room most of the night. Pt has not been asleep this shift. Will cont to monitor

## 2023-10-28 NOTE — Progress Notes (Signed)
 [facility] Progress Note  Patient: Jenna Odom    ZOX:[id number]  DOA: 10/26/2023     Date of Service: the patient was seen and examined on 10/28/2023  Chief Complaint  Patient presents with   Rectal Bleeding   Abdominal Pain   Brief hospital course: Laparis Durrett is a 83 y.o. female with medical history significant of dementia, atrial fibrillation, type 2 diabetes, hypertension presented with rectal bleeding.  Limited history in the setting of dementia and cognitive impairment.  Patient noted of had rectal bleeding at local skilled nursing facility.  Has been present for roughly 1 week.  No reports of recent medication changes.  No reports of recent fall or trauma.  No reported chest pain, shortness of breath, nausea or vomiting.  On Eliquis in setting of atrial fibrillation.  Unclear if this has been stopped at local skilled nursing facility.  Presented to the ER afebrile, hemodynamically stable.  Satting well on room air.  White count 4.9, hemoglobin 6.6, platelets 226, creatinine 1.1, glucose 220s.  CT angio GI bleed with changes concerning for hemorrhoidal versus stercoral nidus of hematochezia.  Noted sizable stool balls in the rectum.  Positive diverticulosis without diverticulitis or evidence of diverticular hemorrhage.Dr. Servando Snare w/ GI consulted.    Assessment and Plan: # GIB (gastrointestinal bleeding) most likely due to internal hemorrhoids # ABLA  + rectal bleeding x 1 week coming from local SNF  CT angio GI bleed with concern for hemorrhoidal or stercoral nidus of Hematochezia, Hgb 6.6  S/p 1 unit PRBC transfusion 2/19 Hb 7.1 borderline low, will transfuse 1 unit of PRBC to keep Hb above 8 Trend hgb, Transfuse for hgb <7  GI consulted, s/p colonoscopy, found to have internal hemorrhoids, no active bleeding.  GI recommended general surgery consult for hemorrhoidal management if recurrent bleeding. Anemia workup, iron profile and folate within normal range.  Vitamin B12  is low  # Hypotension, no blood pressure noticed on 2/20 NS bolus 500 mL order placed Started midodrine 10 mg p.o. 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 twice daily Prn zyprexa for agitation  Monitor    # Hypomagnesemia, mag repleted.  # Vitamin B12 deficiency: Started vitamin B12 1000 mcg IM injection daily during hospital stay, followed by oral supplement.  Follow-up PCP to repeat vitamin B12 level after 3 to 6 months.   Body mass index is 25.35 kg/m.  Interventions:  Diet: Carb modified diet DVT Prophylaxis: SCD, pharmacological prophylaxis contraindicated due to GI bleeding    Advance goals of care discussion: Full code  Family Communication: family was not present at bedside, at the time of interview.  Patient has end-stage dementia, AO x 1  Disposition:  Pt is from SNF, admitted with GI bleeding, s/p colonoscopy done on 2/19, low BP noticed on 2/20, which precludes a safe discharge. Discharge to SNF, when when stable, most likely tomorrow a.m.  Subjective: No significant events overnight, patient has severe dementia, AO x 1.  Able to tell me only her first name.  Resting comfortably, not in acute distress.  Physical Exam: General: NAD, lying comfortably Appear in no distress, affect appropriate Eyes: PERRLA ENT: Oral Mucosa Clear, moist  Neck: no JVD,  Cardiovascular: S1 and S2 Present, no Murmur,  Respiratory: good respiratory effort, Bilateral Air entry equal and Decreased, no Crackles, no  wheezes Abdomen: Bowel Sound present, Soft and no tenderness,  Skin: no rashes Extremities: no Pedal edema, no calf tenderness Neurologic: without any new focal findings Gait not checked due to patient safety concerns  Vitals:   10/27/23 1551 10/27/23 1651 10/27/23 1706 10/27/23 1957  BP: 117/66 134/72 135/66 (!) 89/52  Pulse: 90 89 88 87  Resp: 16 18 18 18   Temp: 98.1 F (36.7 C) 98 F (36.7 C) 98.5 F (36.9 C) (!) 97.5 F (36.4 C)  TempSrc: Oral Oral Oral Axillary  SpO2: 100% 100% 100% 98%  Weight:      Height:        Intake/Output Summary (Last 24 hours) at 10/28/2023 1510 Last data filed at 10/28/2023 1048 Gross per 24 hour  Intake 410.5 ml  Output --  Net 410.5 ml   Filed Weights   10/26/23 0931  Weight: 64.9 kg    Data Reviewed: I have personally reviewed and interpreted daily labs, tele strips, imagings as discussed above. I reviewed all nursing notes, pharmacy notes, vitals, pertinent old records I have discussed plan of care as described above with RN and patient/family.  CBC: Recent Labs  Lab 10/26/23 0932 10/26/23 2126 10/27/23 0133 10/27/23 0744 10/27/23 1551 10/28/23 0525  WBC 4.9  --   --   --   --  9.3  HGB 6.6* 8.1* 7.6* 7.1* 7.5* 8.4*  HCT 21.1* 25.5* 23.8* 22.4* 23.2* 25.6*  MCV 89.0  --   --   --   --  84.2  PLT 226  --   --   --   --  202   Basic Metabolic Panel: Recent Labs  Lab 10/26/23 0932 10/27/23 0406 10/28/23 0525  NA 136 135 141  K 4.8 3.7 4.1  CL 102 103 107  CO2 27 24 26   GLUCOSE 222* 137* 165*  BUN 24* 19 15  CREATININE 1.01* 0.92 1.08*  CALCIUM 8.5* 8.1* 8.7*  MG  --  1.7 1.6*  PHOS  --  3.1 3.5    Studies: No results found.   Scheduled Meds:  apixaban  2.5 mg Oral BID   cyanocobalamin  1,000 mcg Intramuscular Daily   Followed by   Melene Muller ON 11/04/2023] vitamin B-12  1,000 mcg Oral Daily   Gerhardt's butt cream   Topical Daily   insulin aspart  0-9 Units Subcutaneous Q4H   midodrine  10 mg Oral TID WC    pantoprazole (PROTONIX) IV  40 mg Intravenous Q12H   QUEtiapine  25 mg Oral BID   Continuous Infusions:  sodium chloride 75 mL/hr at 10/27/23 0132   sodium chloride     PRN Meds: acetaminophen, dextrose, haloperidol lactate, ondansetron (ZOFRAN) IV  Time spent: 55 minutes  Author: Gillis Santa. MD [facility] 10/28/2023 3:10 PM  To reach On-call, see care teams to locate the attending and reach out to them via [email or web address]. If 7PM-7AM, please contact night-coverage If you still have difficulty reaching the attending provider, please page the [facility] (Director on Call) for [facility] on amion for assistance.

## 2023-10-28 NOTE — Care Management Important Message (Signed)
 Important Message  Patient Details  Name: Jenna Odom MRN: [id number] Date of Birth: 05/13/1941   Important Message Given:  Yes - Medicare IM     Cristela Blue, CMA 10/28/2023, 10:33 AM

## 2023-10-29 DIAGNOSIS — K922 Gastrointestinal hemorrhage, unspecified: Secondary | ICD-10-CM | POA: Diagnosis not present

## 2023-10-29 LAB — CBC
HCT: 24.5 % — ABNORMAL LOW (ref 36.0–46.0)
Hemoglobin: 8.1 g/dL — ABNORMAL LOW (ref 12.0–15.0)
MCH: 27.8 pg (ref 26.0–34.0)
MCHC: 33.1 g/dL (ref 30.0–36.0)
MCV: 84.2 fL (ref 80.0–100.0)
Platelets: 202 10*3/uL (ref 150–400)
RBC: 2.91 MIL/uL — ABNORMAL LOW (ref 3.87–5.11)
RDW: 14.8 % (ref 11.5–15.5)
WBC: 8.4 10*3/uL (ref 4.0–10.5)
nRBC: 0 % (ref 0.0–0.2)

## 2023-10-29 LAB — BASIC METABOLIC PANEL
Anion gap: 7 (ref 5–15)
BUN: 15 mg/dL (ref 8–23)
CO2: 26 mmol/L (ref 22–32)
Calcium: 8.5 mg/dL — ABNORMAL LOW (ref 8.9–10.3)
Chloride: 108 mmol/L (ref 98–111)
Creatinine, Ser: 0.88 mg/dL (ref 0.44–1.00)
GFR, Estimated: >60 mL/min (ref >60)
Glucose, Bld: 141 mg/dL — ABNORMAL HIGH (ref 70–99)
Potassium: 3.8 mmol/L (ref 3.5–5.1)
Sodium: 141 mmol/L (ref 135–145)

## 2023-10-29 LAB — GLUCOSE, CAPILLARY
Glucose-Capillary: 156 mg/dL — ABNORMAL HIGH (ref 70–99)
Glucose-Capillary: 115 mg/dL — ABNORMAL HIGH (ref 70–99)
Glucose-Capillary: 106 mg/dL — ABNORMAL HIGH (ref 70–99)
Glucose-Capillary: 182 mg/dL — ABNORMAL HIGH (ref 70–99)
Glucose-Capillary: 150 mg/dL — ABNORMAL HIGH (ref 70–99)

## 2023-10-29 LAB — PHOSPHORUS: Phosphorus: 3.9 mg/dL (ref 2.5–4.6)

## 2023-10-29 LAB — MAGNESIUM: Magnesium: 1.9 mg/dL (ref 1.7–2.4)

## 2023-10-29 MED ORDER — MIDODRINE HCL 5 MG PO TABS
5.0000 mg | ORAL_TABLET | Freq: Three times a day (TID) | ORAL | Status: DC
  Administered 2023-10-29 – 2023-10-30 (×3): 5 mg via ORAL
  Filled 2023-10-29 (×3): qty 1

## 2023-10-29 MED ORDER — POLYETHYLENE GLYCOL 3350 17 G PO PACK
17.0000 g | PACK | Freq: Every day | ORAL | Status: DC
  Administered 2023-10-29 – 2023-11-03 (×5): 17 g via ORAL
  Filled 2023-10-29 (×5): qty 1

## 2023-10-29 MED ORDER — BISACODYL 5 MG PO TBEC
10.0000 mg | DELAYED_RELEASE_TABLET | Freq: Every day | ORAL | Status: DC
  Administered 2023-10-29 – 2023-11-02 (×5): 10 mg via ORAL
  Filled 2023-10-29 (×5): qty 2

## 2023-10-29 MED ORDER — BISACODYL 10 MG RE SUPP: 10.0000 mg | Freq: Every day | RECTAL | Status: DC | PRN

## 2023-10-29 NOTE — Plan of Care (Signed)
  Problem: Coping: Goal: Ability to adjust to condition or change in health will improve Outcome: Progressing   Problem: Metabolic: Goal: Ability to maintain appropriate glucose levels will improve Outcome: Progressing   Problem: Nutritional: Goal: Maintenance of adequate nutrition will improve Outcome: Progressing   Problem: Skin Integrity: Goal: Risk for impaired skin integrity will decrease Outcome: Progressing   

## 2023-10-29 NOTE — Plan of Care (Signed)

## 2023-10-29 NOTE — Progress Notes (Signed)
 [facility] Progress Note  Patient: Jenna Odom    ZOX:[id number]  DOA: 10/26/2023     Date of Service: the patient was seen and examined on 10/29/2023  Chief Complaint  Patient presents with   Rectal Bleeding   Abdominal Pain   Brief hospital course: Sommer Spickard is a 83 y.o. female with medical history significant of dementia, atrial fibrillation, type 2 diabetes, hypertension presented with rectal bleeding.  Limited history in the setting of dementia and cognitive impairment.  Patient noted of had rectal bleeding at local skilled nursing facility.  Has been present for roughly 1 week.  No reports of recent medication changes.  No reports of recent fall or trauma.  No reported chest pain, shortness of breath, nausea or vomiting.  On Eliquis in setting of atrial fibrillation.  Unclear if this has been stopped at local skilled nursing facility.  Presented to the ER afebrile, hemodynamically stable.  Satting well on room air.  White count 4.9, hemoglobin 6.6, platelets 226, creatinine 1.1, glucose 220s.  CT angio GI bleed with changes concerning for hemorrhoidal versus stercoral nidus of hematochezia.  Noted sizable stool balls in the rectum.  Positive diverticulosis without diverticulitis or evidence of diverticular hemorrhage.Dr. Servando Snare w/ GI consulted.    Assessment and Plan: # GIB (gastrointestinal bleeding) most likely due to internal hemorrhoids # ABLA  + rectal bleeding x 1 week coming from local SNF  CT angio GI bleed with concern for hemorrhoidal or stercoral nidus of Hematochezia, Hgb 6.6  S/p 1 unit PRBC transfusion 2/19 Hb 7.1 borderline low, will transfuse 1 unit of PRBC to keep Hb above 8 Trend hgb, Transfuse for hgb <7  GI consulted, s/p colonoscopy, found to have internal hemorrhoids, no active bleeding.  GI recommended general surgery consult for hemorrhoidal management if recurrent bleeding. Anemia workup, iron profile and folate within normal range.  Vitamin B12  is low  # Hypotension, no blood pressure noticed on 2/20 NS bolus 500 mL order placed Started midodrine 10 mg p.o. 3 times daily with holding parameters BP and titrate medications accordingly 2/21 decreased midodrine 5 mg p.o. 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 twice daily Prn zyprexa for agitation  Monitor  2/21, patient remained confused and has mittens. As per TOC patient needs to be off of mittens for 48 hours, so follow TOC for discharge planning.   # Hypomagnesemia, mag repleted.  # Vitamin B12 deficiency: Started vitamin B12 1000 mcg IM injection daily during hospital stay, followed by oral supplement.  Follow-up PCP to repeat vitamin B12 level after 3 to 6 months.   Body mass index is 25.35 kg/m.  Interventions:  Diet: Carb modified diet DVT Prophylaxis: SCD, pharmacological prophylaxis contraindicated due to GI bleeding    Advance goals of care discussion: Full code  Family Communication: family was not present at bedside, at the time of interview.  Patient has end-stage dementia, AO x 1  Disposition:  Pt is from SNF, admitted with GI bleeding, s/p colonoscopy done on 2/19, low BP noticed on 2/20, which  improved but patient is confused and has mittens.   As per [facility] patient needs to be off of mittens for 48 hours Discharge to SNF, when bed will be available.  Follow TOC.   Most likely on Monday if requires 48 hours mittens off.  Subjective: No significant events overnight, patient has severe dementia, AO x 1.  Able to tell me only her  first name.  Resting comfortably, not in acute distress.  Physical Exam: General: NAD, lying comfortably Appear in no distress, affect appropriate Eyes: PERRLA ENT: Oral Mucosa Clear, moist  Neck: no JVD,  Cardiovascular: S1 and S2 Present, no Murmur,  Respiratory: good respiratory effort, Bilateral Air entry equal and Decreased, no Crackles, no wheezes Abdomen: Bowel Sound present, Soft and no tenderness,  Skin: no rashes Extremities: no Pedal edema, no calf tenderness Neurologic: without any new focal findings Gait not checked due to patient safety concerns  Vitals:   10/28/23 1800 10/28/23 2221 10/29/23 0722 10/29/23 1553  BP: 137/67 (!) 143/79 116/62 121/66  Pulse: 84 76 75 94  Resp: 18 18 17 16   Temp: 98.4 F (36.9 C) (!) 97.5 F (36.4 C)  97.7 F (36.5 C)  TempSrc: Oral Oral    SpO2: 99% 97% 95% 99%  Weight:      Height:        Intake/Output Summary (Last 24 hours) at 10/29/2023 1600 Last data filed at 10/29/2023 0500 Gross per 24 hour  Intake --  Output 300 ml  Net -300 ml   Filed Weights   10/26/23 0931  Weight: 64.9 kg    Data Reviewed: I have personally reviewed and interpreted daily labs, tele strips, imagings as discussed above. I reviewed all nursing notes, pharmacy notes, vitals, pertinent old records I have discussed plan of care as described above with RN and patient/family.  CBC: Recent Labs  Lab 10/26/23 0932 10/26/23 2126 10/27/23 0744 10/27/23 1551 10/28/23 0525 10/28/23 1711 10/29/23 0627  WBC 4.9  --   --   --  9.3  --  8.4  HGB 6.6*   < > 7.1* 7.5* 8.4* 8.7* 8.1*  HCT 21.1*   < > 22.4* 23.2* 25.6* 26.7* 24.5*  MCV 89.0  --   --   --  84.2  --  84.2  PLT 226  --   --   --  202  --  202   < > = values in this interval not displayed.   Basic Metabolic Panel: Recent Labs  Lab 10/26/23 0932 10/27/23 0406 10/28/23 0525 10/29/23 0627  NA 136 135 141 141  K 4.8 3.7 4.1 3.8  CL 102 103 107 108  CO2 27 24 26 26   GLUCOSE 222* 137*  165* 141*  BUN 24* 19 15 15   CREATININE 1.01* 0.92 1.08* 0.88  CALCIUM 8.5* 8.1* 8.7* 8.5*  MG  --  1.7 1.6* 1.9  PHOS  --  3.1 3.5 3.9    Studies: No results found.   Scheduled Meds:  apixaban  2.5 mg Oral BID   bisacodyl  10 mg Oral QHS   cyanocobalamin  1,000 mcg Intramuscular Daily   Followed by   Melene Muller ON 11/04/2023] vitamin B-12  1,000 mcg Oral Daily   Gerhardt's butt cream   Topical Daily   insulin aspart  0-9 Units Subcutaneous Q4H   midodrine  5 mg Oral TID WC   pantoprazole  40 mg Oral Daily   polyethylene glycol  17 g Oral Daily   QUEtiapine  25 mg Oral BID   Continuous Infusions:  sodium chloride 75 mL/hr at 10/27/23 0132   PRN Meds: acetaminophen, bisacodyl, dextrose, haloperidol lactate, ondansetron (ZOFRAN) IV  Time spent: 40 minutes  Author: Gillis Santa. MD [facility] 10/29/2023 4:00 PM  To reach On-call, see care teams to locate the attending and reach out to them via [email or web address]. If 7PM-7AM, please contact night-coverage If you still have difficulty reaching the attending provider, please page the [facility] (Director on Call) for [facility] on amion for assistance.

## 2023-10-30 DIAGNOSIS — E1165 Type 2 diabetes mellitus with hyperglycemia: Secondary | ICD-10-CM

## 2023-10-30 DIAGNOSIS — G3184 Mild cognitive impairment, so stated: Secondary | ICD-10-CM

## 2023-10-30 DIAGNOSIS — D500 Iron deficiency anemia secondary to blood loss (chronic): Secondary | ICD-10-CM | POA: Diagnosis not present

## 2023-10-30 DIAGNOSIS — Z794 Long term (current) use of insulin: Secondary | ICD-10-CM

## 2023-10-30 DIAGNOSIS — K922 Gastrointestinal hemorrhage, unspecified: Secondary | ICD-10-CM | POA: Diagnosis not present

## 2023-10-30 DIAGNOSIS — I4891 Unspecified atrial fibrillation: Secondary | ICD-10-CM | POA: Diagnosis not present

## 2023-10-30 LAB — BPAM RBC
Blood Product Expiration Date: 202503042359
Blood Product Expiration Date: 202503042359
Blood Product Expiration Date: 202503102359
Blood Product Expiration Date: 202503212359
ISSUE DATE / TIME: 202502181805
ISSUE DATE / TIME: 202502191633
Unit Type and Rh: 1700
Unit Type and Rh: 1700
Unit Type and Rh: 9500
Unit Type and Rh: 9500

## 2023-10-30 LAB — CBC
HCT: 24.4 % — ABNORMAL LOW (ref 36.0–46.0)
HCT: 26.1 % — ABNORMAL LOW (ref 36.0–46.0)
Hemoglobin: 8.1 g/dL — ABNORMAL LOW (ref 12.0–15.0)
Hemoglobin: 8.3 g/dL — ABNORMAL LOW (ref 12.0–15.0)
MCH: 28.4 pg (ref 26.0–34.0)
MCH: 27.1 pg (ref 26.0–34.0)
MCHC: 33.2 g/dL (ref 30.0–36.0)
MCHC: 31.8 g/dL (ref 30.0–36.0)
MCV: 85.6 fL (ref 80.0–100.0)
MCV: 85.3 fL (ref 80.0–100.0)
Platelets: 189 10*3/uL (ref 150–400)
Platelets: 188 10*3/uL (ref 150–400)
RBC: 2.85 MIL/uL — ABNORMAL LOW (ref 3.87–5.11)
RBC: 3.06 MIL/uL — ABNORMAL LOW (ref 3.87–5.11)
RDW: 15 % (ref 11.5–15.5)
RDW: 15.1 % (ref 11.5–15.5)
WBC: 7.5 10*3/uL (ref 4.0–10.5)
WBC: 5.8 10*3/uL (ref 4.0–10.5)
nRBC: 0 % (ref 0.0–0.2)
nRBC: 0 % (ref 0.0–0.2)

## 2023-10-30 LAB — GLUCOSE, CAPILLARY
Glucose-Capillary: 103 mg/dL — ABNORMAL HIGH (ref 70–99)
Glucose-Capillary: 122 mg/dL — ABNORMAL HIGH (ref 70–99)
Glucose-Capillary: 135 mg/dL — ABNORMAL HIGH (ref 70–99)
Glucose-Capillary: 180 mg/dL — ABNORMAL HIGH (ref 70–99)
Glucose-Capillary: 191 mg/dL — ABNORMAL HIGH (ref 70–99)
Glucose-Capillary: 311 mg/dL — ABNORMAL HIGH (ref 70–99)

## 2023-10-30 LAB — TYPE AND SCREEN
ABO/RH(D): B NEG
Antibody Screen: POSITIVE
Unit division: 0
Unit division: 0
Unit division: 0
Unit division: 0

## 2023-10-30 LAB — BASIC METABOLIC PANEL
Anion gap: 9 (ref 5–15)
BUN: 18 mg/dL (ref 8–23)
CO2: 27 mmol/L (ref 22–32)
Calcium: 8.7 mg/dL — ABNORMAL LOW (ref 8.9–10.3)
Chloride: 104 mmol/L (ref 98–111)
Creatinine, Ser: 0.92 mg/dL (ref 0.44–1.00)
GFR, Estimated: >60 mL/min (ref >60)
Glucose, Bld: 119 mg/dL — ABNORMAL HIGH (ref 70–99)
Potassium: 3.7 mmol/L (ref 3.5–5.1)
Sodium: 140 mmol/L (ref 135–145)

## 2023-10-30 LAB — MAGNESIUM: Magnesium: 1.7 mg/dL (ref 1.7–2.4)

## 2023-10-30 LAB — PHOSPHORUS: Phosphorus: 3.8 mg/dL (ref 2.5–4.6)

## 2023-10-30 MED ORDER — PANTOPRAZOLE SODIUM 40 MG PO TBEC: 40.0000 mg | DELAYED_RELEASE_TABLET | Freq: Every day | ORAL | Status: AC

## 2023-10-30 MED ORDER — CYANOCOBALAMIN 1000 MCG PO TABS: 1000.0000 ug | ORAL_TABLET | Freq: Every day | ORAL | Status: AC

## 2023-10-30 MED ORDER — POLYETHYLENE GLYCOL 3350 17 G PO PACK: 17.0000 g | PACK | Freq: Every day | ORAL | Status: AC | PRN

## 2023-10-30 MED ORDER — MIDODRINE HCL 5 MG PO TABS: 5.0000 mg | ORAL_TABLET | Freq: Three times a day (TID) | ORAL | Status: AC

## 2023-10-30 NOTE — Discharge Summary (Signed)
 Physician Discharge Summary   Patient: Jenna Odom MRN: [id number] DOB: [birth year]  Admit date:     10/26/2023  Discharge date: 10/30/23  Discharge Physician: Arnetha Courser   PCP: Galvin Proffer, MD   Recommendations at discharge:  Please obtain CBC and BMP on follow-up If patient experience more hemorrhoidal bleeding-will need surgical evaluation for definitive treatment Eliquis has been restarted Please avoid constipation Follow-up with primary care provider within a week.  Discharge Diagnoses: Principal Problem:   GIB (gastrointestinal bleeding) Active Problems:   Mild cognitive impairment   Type 2 diabetes mellitus with hyperglycemia, with long-term current use of insulin (HCC)   Atrial fibrillation (HCC)   Blood loss anemia   Hospital Course: Jenna Odom is a 83 y.o. female with medical history significant of dementia, atrial fibrillation, type 2 diabetes, hypertension presented with rectal bleeding.  Limited history in the setting of dementia and cognitive impairment.  Patient noted of had rectal bleeding at local skilled nursing facility.  Has been present for roughly 1 week.  No reports of recent medication changes.  No reports of recent fall or trauma.  No reported chest pain, shortness of breath, nausea or vomiting.  On Eliquis in setting of atrial fibrillation.   On presentation hemodynamically stable, hemoglobin of 6.6. CT angio GI bleed with changes concerning for hemorrhoidal versus stercoral nidus of hematochezia. Noted sizable stool balls in the rectum. Positive diverticulosis without diverticulitis or evidence of diverticular hemorrhage.   Patient received total of 2 units of PRBC, hemoglobin on the day of discharge was 8.1.  Anemia panel with concern of B12 deficiency so she was started on supplement.  GI was consulted and patient underwent colonoscopy,found to have internal hemorrhoids, no active bleeding. GI recommended general surgery consult for  hemorrhoidal management if recurrent bleeding.   Her home Eliquis was resumed before discharge.  Patient did not had any more bleeding.  She should avoid constipation.  Patient has severe dementia and cognitive impairment.  Mentation seems to be at baseline and patient is being discharged back to her facility.  Patient is on metoprolol for rate control, due to softer blood pressure initially metoprolol was held and she was started on midodrine.  Blood pressure remained borderline so we will continue metoprolol with midodrine.  Midodrine should be held if systolic blood pressure go above 130.  Patient will continue on current medications and need to have a close follow-up with her providers for further management.  Consultants: Gastroenterology Procedures performed: Colonoscopy Disposition: Skilled nursing facility Diet recommendation:  Discharge Diet Orders (From admission, onward)     Start     Ordered   10/30/23 0000  Diet - low sodium heart healthy        10/30/23 1237           Cardiac and Carb modified diet DISCHARGE MEDICATION: Allergies as of 10/30/2023       Reactions   Ciprofloxacin Nausea And Vomiting        Medication List     TAKE these medications    acetaminophen 500 MG tablet Commonly known as: TYLENOL Take 500 mg by mouth in the morning and at bedtime.   brimonidine 0.2 % ophthalmic solution Commonly known as: ALPHAGAN Place 1 drop into both eyes in the morning and at bedtime.   cyanocobalamin 1000 MCG tablet Take 1 tablet (1,000 mcg total) by mouth daily. Start taking on: November 04, 2023   Eliquis 2.5 MG Tabs tablet Generic drug: apixaban Take 2.5 mg by  mouth 2 (two) times daily.   Hemorrhoidal 0.25-14-74.9 % rectal ointment Generic drug: phenylephrine-shark liver oil-mineral oil-petrolatum Place 1 Application rectally every 6 (six) hours as needed for hemorrhoids.   insulin glargine 100 UNIT/ML injection Commonly known as: LANTUS Inject  6-8 Units into the skin in the morning and at bedtime. Injects 8 units sub in the morning and 6 units at bedtime. What changed: Another medication with the same name was removed. Continue taking this medication, and follow the directions you see here.   latanoprost 0.005 % ophthalmic solution Commonly known as: XALATAN Place 1 drop into both eyes at bedtime.   LORazepam 0.5 MG tablet Commonly known as: ATIVAN Take 0.5 mg by mouth daily as needed for anxiety.   magnesium gluconate 500 MG tablet Commonly known as: MAGONATE Take 500 mg by mouth daily.   Melatonin 10 MG Tabs Take 10 mg by mouth at bedtime.   metFORMIN 1000 MG tablet Commonly known as: GLUCOPHAGE Take 1 tablet (1,000 mg total) by mouth 2 (two) times daily with a meal.   metoprolol tartrate 25 MG tablet Commonly known as: LOPRESSOR Take 1 tablet by mouth twice daily   midodrine 5 MG tablet Commonly known as: PROAMATINE Take 1 tablet (5 mg total) by mouth 3 (three) times daily with meals. Please hold if systolic above 130   ondansetron 4 MG tablet Commonly known as: ZOFRAN Take 4 mg by mouth every 6 (six) hours as needed for nausea or vomiting.   pantoprazole 40 MG tablet Commonly known as: PROTONIX Take 1 tablet (40 mg total) by mouth daily. Start taking on: October 31, 2023   polyethylene glycol 17 g packet Commonly known as: MIRALAX / GLYCOLAX Take 17 g by mouth daily as needed.   sertraline 50 MG tablet Commonly known as: ZOLOFT Take 50 mg by mouth daily. Take 1 and half tablets by mouth once a day.   sitaGLIPtin 100 MG tablet Commonly known as: JANUVIA Take 100 mg by mouth daily.   traZODone 50 MG tablet Commonly known as: DESYREL Take 50 mg by mouth at bedtime.   Vitamin D3 50 MCG (2000 UT) Chew Chew 50 mcg by mouth daily.   zinc oxide 20 % ointment Apply 1 Application topically 3 (three) times daily.        Follow-up Information     Hague, Myrene Galas, MD. Schedule an appointment as soon  as possible for a visit in 1 week(s).   Specialty: Internal Medicine Contact information: [address] [place] [address] [phone]                Discharge Exam: Filed Weights   10/26/23 0931  Weight: 64.9 kg   General.  Frail elderly lady, in no acute distress. Pulmonary.  Lungs clear bilaterally, normal respiratory effort. CV.  Regular rate and rhythm, no JVD, rub or murmur. Abdomen.  Soft, nontender, nondistended, BS positive. CNS.  Alert and oriented .  No focal neurologic deficit. Extremities.  No edema, no cyanosis, pulses intact and symmetrical.  Condition at discharge: stable  The results of significant diagnostics from this hospitalization (including imaging, microbiology, ancillary and laboratory) are listed below for reference.   Imaging Studies: DG Abdomen 1 View Result Date: 10/26/2023 CLINICAL DATA:  NG tube repositioning. EXAM: ABDOMEN - 1 VIEW COMPARISON:  Earlier today FINDINGS: Tip and side port of the enteric tube are now located below the diaphragm in the stomach. Nonobstructive upper abdominal bowel gas pattern. IMPRESSION: Tip and side port of  the enteric tube are now located below the diaphragm in the stomach. Electronically Signed   By: Narda Rutherford M.D.   On: 10/26/2023 22:00   DG Abdomen 1 View Result Date: 10/26/2023 CLINICAL DATA:  NG tube placement. Patient pulled out NG tube, subsequently replaced. EXAM: ABDOMEN - 1 VIEW COMPARISON:  Earlier today.  Subsequent radiograph also reviewed. FINDINGS: The enteric tube is looped in the distal esophagus, tip directed cranially not included in the field of view. This has been subsequently adjusted. No bowel dilatation in the upper abdomen. IMPRESSION: Enteric tube looped in the distal esophagus, tip directed cranially not included in the field of view. This has been subsequently adjusted. Electronically Signed   By: Narda Rutherford M.D.   On: 10/26/2023 21:57   DG Abdomen 1 View Result Date:  10/26/2023 CLINICAL DATA:  NG placement. EXAM: ABDOMEN - 1 VIEW COMPARISON:  CT abdomen pelvis dated 10/26/2023. FINDINGS: Enteric tube with side port and tip in the left upper abdomen in the proximal stomach. No bowel dilatation or evidence of obstruction. Excreted contrast noted in the urinary bladder. Osteopenia with degenerative changes of spine. No acute osseous pathology. IMPRESSION: Enteric tube with tip in the proximal stomach. Electronically Signed   By: Elgie Collard M.D.   On: 10/26/2023 19:11   CT ANGIO GI BLEED Result Date: 10/26/2023 CLINICAL DATA:  Lower abdominal pain, hematochezia, blood loss anemia requiring transfusion, rectal bleeding for 1 week EXAM: CTA ABDOMEN AND PELVIS WITHOUT AND WITH CONTRAST TECHNIQUE: Multidetector CT imaging of the abdomen and pelvis was performed using the standard protocol during bolus administration of intravenous contrast. Multiplanar reconstructed images and MIPs were obtained and reviewed to evaluate the vascular anatomy. RADIATION DOSE REDUCTION: This exam was performed according to the departmental dose-optimization program which includes automated exposure control, adjustment of the mA and/or kV according to patient size and/or use of iterative reconstruction technique. CONTRAST:  OMNIPAQUE IOHEXOL 350 MG/ML SOLN COMPARISON:  None Available. FINDINGS: VASCULAR Severe aortic atherosclerosis. Normal contour and caliber of the abdominal aorta. No evidence of aneurysm, dissection, or other acute aortic pathology. Standard branching pattern of the abdominal aorta with solitary bilateral renal arteries. Review of the MIP images confirms the above findings. NON-VASCULAR Lower Chest: No acute findings.  Coronary artery calcifications. Hepatobiliary: No solid liver abnormality is seen. Tiny gallstones. No gallbladder wall thickening, or biliary dilatation. Pancreas: Unremarkable. No pancreatic ductal dilatation or surrounding inflammatory changes. Spleen:  Normal in size without significant abnormality. Adrenals/Urinary Tract: Adrenal glands are unremarkable. Lobulated renal cortical scarring. Kidneys are otherwise normal, without renal calculi, solid lesion, or hydronephrosis. Bladder is unremarkable. Stomach/Bowel: Stomach is within normal limits. Large periampullary duodenal diverticulum hyperdense ingested material within the gastric lumen on precontrast images. Appendix not clearly visualized. No evidence of bowel wall thickening, distention, or inflammatory changes. Sigmoid diverticula. Small volume of contrast within the rectal vault on venous phase imaging (series 17, image 199, 206). Sizable stool balls in the rectum. Lymphatic: No enlarged abdominal or pelvic lymph nodes. Reproductive: Status post hysterectomy. Other: No abdominal wall hernia or abnormality. No ascites. Musculoskeletal: No acute osseous findings. IMPRESSION: 1. Small volume of contrast within the rectal vault on venous phase imaging, suggestive of hemorrhoidal or stercoral nidus of hematochezia. Sizable stool balls in the rectum. 2. Sigmoid diverticulosis without evidence of acute diverticulitis or specific evidence of diverticular hemorrhage. 3. Cholelithiasis without evidence of acute cholecystitis. 4. Coronary artery disease. 5. Severe aortic atherosclerosis. Aortic Atherosclerosis (ICD10-I70.0). Electronically Signed   By:  Jearld Lesch M.D.   On: 10/26/2023 11:47    Microbiology: Results for orders placed or performed during the hospital encounter of 06/26/20  Urine Culture     Status: Abnormal   Collection Time: 06/26/20  8:35 AM   Specimen: Urine, Random  Result Value Ref Range Status   Specimen Description   Final    URINE, RANDOM Performed at [facility], [address] [place], [place] [address]    Special Requests   Final    NONE Performed at [facility], [address], [place], [place] [address]    Culture >=100,000 COLONIES/mL  PROTEUS MIRABILIS (A)  Final   Report Status 06/29/2020 FINAL  Final   Organism ID, Bacteria PROTEUS MIRABILIS (A)  Final      Susceptibility   Proteus mirabilis - MIC*    AMPICILLIN <=2 SENSITIVE Sensitive     CEFAZOLIN <=4 SENSITIVE Sensitive     CEFTRIAXONE <=0.25 SENSITIVE Sensitive     CIPROFLOXACIN <=0.25 SENSITIVE Sensitive     GENTAMICIN <=1 SENSITIVE Sensitive     IMIPENEM 1 SENSITIVE Sensitive     NITROFURANTOIN 128 RESISTANT Resistant     TRIMETH/SULFA <=20 SENSITIVE Sensitive     AMPICILLIN/SULBACTAM <=2 SENSITIVE Sensitive     PIP/TAZO <=4 SENSITIVE Sensitive     * >=100,000 COLONIES/mL PROTEUS MIRABILIS    Labs: CBC: Recent Labs  Lab 10/26/23 0932 10/26/23 2126 10/27/23 1551 10/28/23 0525 10/28/23 1711 10/29/23 0627 10/30/23 0346  WBC 4.9  --   --  9.3  --  8.4 7.5  HGB 6.6*   < > 7.5* 8.4* 8.7* 8.1* 8.1*  HCT 21.1*   < > 23.2* 25.6* 26.7* 24.5* 24.4*  MCV 89.0  --   --  84.2  --  84.2 85.6  PLT 226  --   --  202  --  202 189   < > = values in this interval not displayed.   Basic Metabolic Panel: Recent Labs  Lab 10/26/23 0932 10/27/23 0406 10/28/23 0525 10/29/23 0627 10/30/23 0346  NA 136 135 141 141 140  K 4.8 3.7 4.1 3.8 3.7  CL 102 103 107 108 104  CO2 27 24 26 26 27   GLUCOSE 222* 137* 165* 141* 119*  BUN 24* 19 15 15 18   CREATININE 1.01* 0.92 1.08* 0.88 0.92  CALCIUM 8.5* 8.1* 8.7* 8.5* 8.7*  MG  --  1.7 1.6* 1.9 1.7  PHOS  --  3.1 3.5 3.9 3.8   Liver Function Tests: Recent Labs  Lab 10/26/23 0932 10/27/23 0406  AST 17 38  ALT 12 18  ALKPHOS 39 36*  BILITOT 0.6 1.2  PROT 5.7* 5.5*  ALBUMIN 3.5 3.4*   CBG: Recent Labs  Lab 10/29/23 2015 10/30/23 0057 10/30/23 0337 10/30/23 0823 10/30/23 1148  GLUCAP 150* 135* 103* 122* 311*    Discharge time spent: greater than 30 minutes.  This record has been created using Conservation officer, historic buildings. Errors have been sought and corrected,but may not always be located. Such  creation errors do not reflect on the standard of care.   Signed: Arnetha Courser, MD [facility] 10/30/2023

## 2023-10-30 NOTE — TOC Transition Note (Signed)
 Transition of Care [facility]) - Discharge Note   Patient Details  Name: Jenna Odom MRN: [id number] Date of Birth: [birth year]  [facility] CM/SW Contact:  Bing Quarry, RN Phone Number: 10/30/2023, 12:33 PM   Clinical Narrative:  10/30/23. Discharge back to [facility] today and daughter is to transport back to facility. Spoke with Ellwood Sayers at ALF and will fax DC Summary to office fax at [phone].    Gabriel Cirri MSN RN CM  RN Case Manager [place]  Transitions of Care Direct Dial: [phone] (Weekends Only) [facility] Main Office Phone: [phone] [facility] Fax: [phone] [email or web address]      Final next level of care: Assisted Living ([facility]) Barriers to Discharge: No Barriers Identified   Patient Goals and CMS Choice   CMS Medicare.gov Compare Post Acute Care list provided to:: Patient Choice offered to / list presented to : Patient      Discharge Placement                Patient to be transferred to facility by: Daughter      Discharge Plan and Services Additional resources added to the After Visit Summary for     Discharge Planning Services: CM Consult            DME Arranged: N/A DME Agency: NA       HH Arranged: NA HH Agency: NA        Social Drivers of Health (SDOH) Interventions SDOH Screenings   Food Insecurity: No Food Insecurity (05/29/2020)  Housing: Low Risk  (05/29/2020)  Transportation Needs: No Transportation Needs (05/29/2020)  Depression (PHQ2-9): Low Risk  (05/29/2020)  Financial Resource Strain: Low Risk  (05/29/2020)  Physical Activity: Inactive (05/29/2020)  Social Connections: Moderately Isolated (05/29/2020)  Stress: No Stress Concern Present (05/29/2020)  Tobacco Use: Low Risk  (10/26/2023)     Readmission Risk Interventions     No data to display

## 2023-10-30 NOTE — Consult Note (Signed)
 Patient ID: Jenna Odom, female   DOB: 1940/10/14, 83 y.o.   MRN: [id number]  HPI Jenna Odom is a 83 y.o. female seen in consultation at the request of Dr. Nelson Chimes.  She does have a history of dementia atrial fibrillation, diabetes and is anticoagulated She lives in a facility and was recently admitted for GI bleed.  At that time the local skilled nursing facility noted rectal bleeding.  She presented to the ER and she was hemodynamically appropriate with a hemoglobin of 6.6.  She was appropriately transfused and a CT angio was obtained showing evidence of diverticulosi and contrast in the rectal vault.  She also underwent a colonoscopy by GI and I have personally reviewed the images showing diverticulosis and some very small hemorrhoids. She responded appropriate transfusion and hemoglobin this morning was 8.1 and repeat 1 this afternoon was 8.3.  Her eliquis was restarted.  HPI  Past Medical History:  Diagnosis Date   A-fib (HCC)    Anxiety    Dementia (HCC)    Diabetes mellitus without complication (HCC)    Hypertension     Past Surgical History:  Procedure Laterality Date   ABDOMINAL HYSTERECTOMY     COLONOSCOPY WITH PROPOFOL N/A 10/27/2023   Procedure: COLONOSCOPY WITH PROPOFOL;  Surgeon: [name], MD;  Location: [facility] ENDOSCOPY;  Service: Endoscopy;  Laterality: N/A;    Family History  Problem Relation Age of Onset   Dementia Mother    Other Father        gun shot    Social History Social History   Tobacco Use   Smoking status: Never   Smokeless tobacco: Never  Vaping Use   Vaping status: Never Used  Substance Use Topics   Alcohol use: Never   Drug use: Never    Allergies  Allergen Reactions   Ciprofloxacin Nausea And Vomiting    Current Facility-Administered Medications  Medication Dose Route Frequency Provider Last Rate Last Admin   acetaminophen (TYLENOL) tablet 650 mg  650 mg Oral Q6H PRN Gillis Santa, MD       bisacodyl (DULCOLAX) EC tablet  10 mg  10 mg Oral QHS Gillis Santa, MD   10 mg at 10/29/23 2133   bisacodyl (DULCOLAX) suppository 10 mg  10 mg Rectal Daily PRN Gillis Santa, MD       cyanocobalamin (VITAMIN B12) injection 1,000 mcg  1,000 mcg Intramuscular Daily Gillis Santa, MD   1,000 mcg at 10/30/23 0901   Followed by   Melene Muller ON 11/04/2023] cyanocobalamin (VITAMIN B12) tablet 1,000 mcg  1,000 mcg Oral Daily Gillis Santa, MD       dextrose 50 % solution 12.5 g  12.5 g Intravenous PRN Gillis Santa, MD       Gerhardt's butt cream   Topical Daily Jawo, Modou L, NP   Given at 10/30/23 0902   haloperidol lactate (HALDOL) injection 2 mg  2 mg Intravenous Q6H PRN Floydene Flock, MD   2 mg at 10/28/23 1451   insulin aspart (novoLOG) injection 0-9 Units  0-9 Units Subcutaneous Q4H Floydene Flock, MD   7 Units at 10/30/23 1240   midodrine (PROAMATINE) tablet 5 mg  5 mg Oral TID WC Gillis Santa, MD   5 mg at 10/30/23 1241   ondansetron (ZOFRAN) injection 4 mg  4 mg Intravenous Q6H PRN Gillis Santa, MD       pantoprazole (PROTONIX) EC tablet 40 mg  40 mg Oral Daily Gillis Santa, MD   40 mg at 10/30/23  0902   polyethylene glycol (MIRALAX / GLYCOLAX) packet 17 g  17 g Oral Daily Gillis Santa, MD   17 g at 10/30/23 0902   QUEtiapine (SEROQUEL) tablet 25 mg  25 mg Oral BID Gillis Santa, MD   25 mg at 10/30/23 1610     Review of Systems Unable to obtain it secondary to cortical deficits.  Physical Exam Blood pressure (!) 104/55, pulse (!) 103, temperature 98.2 F (36.8 C), temperature source Oral, resp. rate 18, height 5\' 3"  (1.6 m), weight 64.9 kg, SpO2 97%. CONSTITUTIONAL: NAD. EYES: Pupils are equal, round, Sclera are non-icteric. EARS, NOSE, MOUTH AND THROAT: The oropharynx is clear. The oral mucosa is pink and moist. Hearing is intact to voice. LYMPH NODES:  Lymph nodes in the neck are normal. RESPIRATORY:  Lungs are clear. There is normal respiratory effort, with equal breath sounds bilaterally, and without  pathologic use of accessory muscles. CARDIOVASCULAR: Heart is regular without murmurs, gallops, or rubs. GI: The abdomen is  soft, nontender, and nondistended. There are no palpable masses. There is no hepatosplenomegaly. There are normal bowel sounds in all quadrants. Rectal: no fissures, masses or hemorrhoids. She clearly has melanotic stool. MUSCULOSKELETAL: Normal muscle strength and tone. No cyanosis or edema.   SKIN: Turgor is good and there are no pathologic skin lesions or ulcers. NEUROLOGIC: Motor and sensation is grossly normal. Cranial nerves are grossly intact. PSYCH:  Oriented to person, disoriented to place and time. Affect is normal.  Data Reviewed  I have personally reviewed the patient's imaging, laboratory findings and medical records.    Assessment/Plan 83 year old female with recurrent GI bleed.  At this point there is no evidence of active bleeding from the anal rectal area.  She clearly has melena and the source is proximal to the anal rectal canal.  Recommend repeat CTA versus flex sig to identify the source. I will not be able to offer any surgical intervention if we are able to identify the appropriate source of bleed. We will follow her as she may need additional w/o and potential intervention. No need for emergent surgical interventions at this time.  Sterling Big, MD FACS General Surgeon 10/30/2023, 1:40 PM

## 2023-10-30 NOTE — Progress Notes (Signed)
 [facility] Progress Note  Patient: Jenna Odom    ZOX:[id number]  DOA: 10/26/2023     Date of Service: the patient was seen and examined on 10/30/2023  Chief Complaint  Patient presents with   Rectal Bleeding   Abdominal Pain   Brief hospital course: Kirstina Leinweber is a 83 y.o. female with medical history significant of dementia, atrial fibrillation, type 2 diabetes, hypertension presented with rectal bleeding.  Limited history in the setting of dementia and cognitive impairment.  Patient noted of had rectal bleeding at local skilled nursing facility.  Has been present for roughly 1 week.  No reports of recent medication changes.  No reports of recent fall or trauma.  No reported chest pain, shortness of breath, nausea or vomiting.  On Eliquis in setting of atrial fibrillation.  Unclear if this has been stopped at local skilled nursing facility.  Presented to the ER afebrile, hemodynamically stable.  Satting well on room air.  White count 4.9, hemoglobin 6.6, platelets 226, creatinine 1.1, glucose 220s.  CT angio GI bleed with changes concerning for hemorrhoidal versus stercoral nidus of hematochezia.  Noted sizable stool balls in the rectum.  Positive diverticulosis without diverticulitis or evidence of diverticular hemorrhage.Dr. Servando Snare w/ GI consulted.   2/22: Patient was initially hemodynamically stable, she was discharged back to her facility and just before leaving she had another episode of bleeding per rectum.  General surgery was consulted as advised by GI due to concern from recurrence of bleeding from internal hemorrhoid.  Repeat CBC ordered  Assessment and Plan: # GIB (gastrointestinal bleeding) most likely due to internal hemorrhoids # ABLA  GI consulted, s/p colonoscopy, found to have internal hemorrhoids, no active bleeding.  GI recommended general surgery consult for hemorrhoidal management if recurrent bleeding. Anemia workup, iron profile and folate within normal  range.  Vitamin B12 is low Received total of 2 unit of PRBC.  Hemoglobin was 8.1 this morning. Another episode of lower GI bleed. -Recheck CBC -Transfuse if below 8 -Consulted general surgery  # Hypotension, improving with midodrine, dose was decreased. BP and titrate medications accordingly 2/21 decreased midodrine 5 mg p.o. 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 twice daily Prn zyprexa for agitation  Monitor  2/21, patient remained confused and has mittens. As per [facility] patient needs to be off of mittens for 48 hours, so follow TOC for discharge planning. 2/22: Patient seems to be at baseline.  Appears pleasant and wants to go back to her place  # Hypomagnesemia, mag repleted.  # Vitamin B12 deficiency: Started vitamin B12 1000 mcg IM injection daily during hospital stay, followed by oral supplement.  Follow-up PCP to repeat vitamin B12 level after 3 to 6 months.   Body mass index is 25.35 kg/m.  Interventions:  Diet: Carb modified diet DVT Prophylaxis: SCD, pharmacological prophylaxis contraindicated due to GI bleeding    Advance goals of care discussion: Full code  Family Communication: Discussed with daughter at bedside  Disposition:  Patient is from SNF and plan was  to go back to her facility, just before leaving had another episode of GI bleeding.  Subjective: Patient was feeling much improved and at baseline when seen during morning rounds.  She wants to go back to her place.  Physical  Exam: General.  Frail elderly lady, in no acute distress. Pulmonary.  Lungs clear bilaterally, normal respiratory effort. CV.  Regular rate and rhythm, no JVD, rub or murmur. Abdomen.  Soft, nontender, nondistended, BS positive. CNS.  Alert and oriented .  No focal neurologic deficit. Extremities.  No edema, no cyanosis, pulses intact and symmetrical.  Vitals:   10/29/23 1553 10/29/23 2002 10/30/23 0756 10/30/23 1124  BP: 121/66 117/80 132/82 (!) 104/55  Pulse: 94 62 (!) 109 (!) 103  Resp: 16 18 16 18   Temp: 97.7 F (36.5 C) 98.3 F (36.8 C) 98.2 F (36.8 C)   TempSrc: Oral Oral Oral   SpO2: 99% 98% 99% 97%  Weight:      Height:        Intake/Output Summary (Last 24 hours) at 10/30/2023 1320 Last data filed at 10/29/2023 1900 Gross per 24 hour  Intake 100 ml  Output 475 ml  Net -375 ml   Filed Weights   10/26/23 0931  Weight: 64.9 kg    Data Reviewed: I have personally reviewed and interpreted daily labs, tele strips, imagings as discussed above. I reviewed all nursing notes, pharmacy notes, vitals, pertinent old records I have discussed plan of care as described above with RN and patient/family.  CBC: Recent Labs  Lab 10/26/23 0932 10/26/23 2126 10/27/23 1551 10/28/23 0525 10/28/23 1711 10/29/23 0627 10/30/23 0346  WBC 4.9  --   --  9.3  --  8.4 7.5  HGB 6.6*   < > 7.5* 8.4* 8.7* 8.1* 8.1*  HCT 21.1*   < > 23.2* 25.6* 26.7* 24.5* 24.4*  MCV 89.0  --   --  84.2  --  84.2 85.6  PLT 226  --   --  202  --  202 189   < > = values in this interval not displayed.   Basic Metabolic Panel: Recent Labs  Lab 10/26/23 0932 10/27/23 0406 10/28/23 0525 10/29/23 0627 10/30/23 0346  NA 136 135 141 141 140  K 4.8 3.7 4.1 3.8 3.7  CL 102 103 107 108 104  CO2 27 24 26 26 27   GLUCOSE 222* 137* 165* 141* 119*  BUN 24* 19 15 15 18   CREATININE 1.01* 0.92 1.08* 0.88 0.92  CALCIUM 8.5* 8.1* 8.7* 8.5* 8.7*  MG  --  1.7 1.6* 1.9 1.7  PHOS  --  3.1 3.5 3.9 3.8     Studies: No results found.   Scheduled Meds:  bisacodyl  10 mg Oral QHS   cyanocobalamin  1,000 mcg Intramuscular Daily   Followed by   Melene Muller ON 11/04/2023] vitamin B-12  1,000 mcg Oral Daily   Gerhardt's butt cream   Topical Daily   insulin aspart  0-9 Units Subcutaneous Q4H   midodrine  5 mg Oral TID WC   pantoprazole  40 mg Oral Daily   polyethylene glycol  17 g Oral Daily   QUEtiapine  25 mg Oral BID   Continuous Infusions:   PRN Meds: acetaminophen, bisacodyl, dextrose, haloperidol lactate, ondansetron (ZOFRAN) IV  Time spent: 44 minutes  Author: Arnetha Courser. MD [facility] 10/30/2023 1:20 PM  To reach On-call, see care teams to locate the attending and reach out to them via [email or web address]. If 7PM-7AM, please contact  night-coverage If you still have difficulty reaching the attending provider, please page the [facility] (Director on Call) for [facility] on amion for assistance.

## 2023-10-30 NOTE — Progress Notes (Signed)
 Spoke with Tammy from [facility] and was able to give report. Tammy stated that is was ok for her to return to the facility. This nurse reported that her daughter would be leaving shortly to return her mother to the facility.

## 2023-10-31 DIAGNOSIS — I4891 Unspecified atrial fibrillation: Secondary | ICD-10-CM | POA: Diagnosis not present

## 2023-10-31 DIAGNOSIS — G3184 Mild cognitive impairment, so stated: Secondary | ICD-10-CM | POA: Diagnosis not present

## 2023-10-31 DIAGNOSIS — K921 Melena: Secondary | ICD-10-CM | POA: Diagnosis not present

## 2023-10-31 DIAGNOSIS — K648 Other hemorrhoids: Secondary | ICD-10-CM | POA: Diagnosis not present

## 2023-10-31 DIAGNOSIS — D500 Iron deficiency anemia secondary to blood loss (chronic): Secondary | ICD-10-CM | POA: Diagnosis not present

## 2023-10-31 DIAGNOSIS — K922 Gastrointestinal hemorrhage, unspecified: Secondary | ICD-10-CM | POA: Diagnosis not present

## 2023-10-31 DIAGNOSIS — D62 Acute posthemorrhagic anemia: Secondary | ICD-10-CM | POA: Diagnosis not present

## 2023-10-31 DIAGNOSIS — K5730 Diverticulosis of large intestine without perforation or abscess without bleeding: Secondary | ICD-10-CM | POA: Diagnosis not present

## 2023-10-31 LAB — CBC
HCT: 25.7 % — ABNORMAL LOW (ref 36.0–46.0)
Hemoglobin: 8.2 g/dL — ABNORMAL LOW (ref 12.0–15.0)
MCH: 27.8 pg (ref 26.0–34.0)
MCHC: 31.9 g/dL (ref 30.0–36.0)
MCV: 87.1 fL (ref 80.0–100.0)
Platelets: 190 10*3/uL (ref 150–400)
RBC: 2.95 MIL/uL — ABNORMAL LOW (ref 3.87–5.11)
RDW: 15 % (ref 11.5–15.5)
WBC: 5.5 10*3/uL (ref 4.0–10.5)
nRBC: 0 % (ref 0.0–0.2)

## 2023-10-31 LAB — BASIC METABOLIC PANEL
Anion gap: 9 (ref 5–15)
BUN: 16 mg/dL (ref 8–23)
CO2: 26 mmol/L (ref 22–32)
Calcium: 9 mg/dL (ref 8.9–10.3)
Chloride: 104 mmol/L (ref 98–111)
Creatinine, Ser: 1 mg/dL (ref 0.44–1.00)
GFR, Estimated: 56 mL/min — ABNORMAL LOW (ref >60)
Glucose, Bld: 139 mg/dL — ABNORMAL HIGH (ref 70–99)
Potassium: 3.4 mmol/L — ABNORMAL LOW (ref 3.5–5.1)
Sodium: 139 mmol/L (ref 135–145)

## 2023-10-31 LAB — GLUCOSE, CAPILLARY
Glucose-Capillary: 134 mg/dL — ABNORMAL HIGH (ref 70–99)
Glucose-Capillary: 121 mg/dL — ABNORMAL HIGH (ref 70–99)
Glucose-Capillary: 140 mg/dL — ABNORMAL HIGH (ref 70–99)
Glucose-Capillary: 150 mg/dL — ABNORMAL HIGH (ref 70–99)
Glucose-Capillary: 159 mg/dL — ABNORMAL HIGH (ref 70–99)
Glucose-Capillary: 181 mg/dL — ABNORMAL HIGH (ref 70–99)

## 2023-10-31 MED ORDER — POTASSIUM CHLORIDE CRYS ER 20 MEQ PO TBCR
20.0000 meq | EXTENDED_RELEASE_TABLET | Freq: Once | ORAL | Status: AC
  Administered 2023-10-31: 20 meq via ORAL
  Filled 2023-10-31: qty 1

## 2023-10-31 NOTE — Plan of Care (Signed)
  Problem: Education: Goal: Ability to describe self-care measures that may prevent or decrease complications (Diabetes Survival Skills Education) will improve Outcome: Progressing   Problem: Coping: Goal: Ability to adjust to condition or change in health will improve Outcome: Progressing   Problem: Fluid Volume: Goal: Ability to maintain a balanced intake and output will improve Outcome: Progressing   Problem: Metabolic: Goal: Ability to maintain appropriate glucose levels will improve Outcome: Progressing   Problem: Skin Integrity: Goal: Risk for impaired skin integrity will decrease Outcome: Progressing   Problem: Tissue Perfusion: Goal: Adequacy of tissue perfusion will improve Outcome: Progressing   Problem: Safety: Goal: Non-violent Restraint(s) Outcome: Progressing   Problem: Education: Goal: Knowledge of General Education information will improve Description: Including pain rating scale, medication(s)/side effects and non-pharmacologic comfort measures Outcome: Progressing   Problem: Activity: Goal: Risk for activity intolerance will decrease Outcome: Progressing   Problem: Nutrition: Goal: Adequate nutrition will be maintained Outcome: Progressing   Problem: Coping: Goal: Level of anxiety will decrease Outcome: Progressing   Problem: Pain Managment: Goal: General experience of comfort will improve and/or be controlled Outcome: Progressing   Problem: Safety: Goal: Ability to remain free from injury will improve Outcome: Progressing   Problem: Skin Integrity: Goal: Risk for impaired skin integrity will decrease Outcome: Progressing

## 2023-10-31 NOTE — Progress Notes (Signed)
 Jenna Repress, MD [address]  [address]  [place], [place] [address]  Main: [phone]  Fax: [phone] Pager: [phone]   Subjective: I was asked to see this patient for recurrent episode of hematochezia which was witnessed yesterday morning after resuming Eliquis on 2/20.  Patient was also evaluated by Dr. Everlene Farrier, surgeon on-call.  Per nurse taken patient's nurse, she did not have any further episodes since yesterday morning.  She did not have any further bowel movements   Objective: Vital signs in last 24 hours: Vitals:   10/30/23 1124 10/30/23 1527 10/30/23 2046 10/31/23 0730  BP: (!) 104/55 (!) 142/74 115/74 (!) 142/97  Pulse: (!) 103 98 97 96  Resp: 18 16 17 16   Temp:  98.3 F (36.8 C) 98.2 F (36.8 C) 97.6 F (36.4 C)  TempSrc:  Oral Oral   SpO2: 97% 98% 98% 100%  Weight:      Height:       Weight change:   Intake/Output Summary (Last 24 hours) at 10/31/2023 1058 Last data filed at 10/31/2023 0744 Gross per 24 hour  Intake 240 ml  Output --  Net 240 ml     Exam: Heart: Regular rate and rhythm, S1S2 present, or without murmur or extra heart sounds Lungs: clear to auscultation Abdomen: soft, nontender, normal bowel sounds Rectal exam: [place] colored stool on digital rectal exam   Lab Results:    Latest Ref Rng & Units 10/31/2023    2:50 AM 10/30/2023    1:26 PM 10/30/2023    3:46 AM  CBC  WBC 4.0 - 10.5 K/uL 5.5  5.8  7.5   Hemoglobin 12.0 - 15.0 g/dL 8.2  8.3  8.1   Hematocrit 36.0 - 46.0 % 25.7  26.1  24.4   Platelets 150 - 400 K/uL 190  188  189       Latest Ref Rng & Units 10/31/2023    2:50 AM 10/30/2023    3:46 AM 10/29/2023    6:27 AM  CMP  Glucose 70 - 99 mg/dL 962  952  841   BUN 8 - 23 mg/dL 16  18  15    Creatinine 0.44 - 1.00 mg/dL 3.24  4.01  0.27   Sodium 135 - 145 mmol/L 139  140  141   Potassium 3.5 - 5.1 mmol/L 3.4  3.7  3.8   Chloride 98 - 111 mmol/L 104  104  108   CO2 22 - 32 mmol/L 26  27  26    Calcium 8.9 -  10.3 mg/dL 9.0  8.7  8.5     Micro Results: No results found for this or any previous visit (from the past 240 hours). Studies/Results: No results found. Medications: I have reviewed the patient's current medications. Prior to Admission:  Medications Prior to Admission  Medication Sig Dispense Refill Last Dose/Taking   acetaminophen (TYLENOL) 500 MG tablet Take 500 mg by mouth in the morning and at bedtime.   10/26/2023 Morning   apixaban (ELIQUIS) 2.5 MG TABS tablet Take 2.5 mg by mouth 2 (two) times daily.   10/26/2023 at  8:00 AM   brimonidine (ALPHAGAN) 0.2 % ophthalmic solution Place 1 drop into both eyes in the morning and at bedtime.   10/26/2023 Morning   Cholecalciferol (VITAMIN D3) 50 MCG (2000 UT) CHEW Chew 50 mcg by mouth daily.   10/26/2023 at  8:00 AM   HEMORRHOIDAL 0.25-14-74.9 % rectal ointment Place 1 Application rectally every 6 (six) hours  as needed for hemorrhoids.   Taking As Needed   insulin glargine (LANTUS) 100 UNIT/ML injection Inject 6-8 Units into the skin in the morning and at bedtime. Injects 8 units sub in the morning and 6 units at bedtime.   10/26/2023 at  8:00 AM   latanoprost (XALATAN) 0.005 % ophthalmic solution Place 1 drop into both eyes at bedtime.   10/25/2023 at  8:00 PM   LORazepam (ATIVAN) 0.5 MG tablet Take 0.5 mg by mouth daily as needed for anxiety.   10/25/2023   magnesium gluconate (MAGONATE) 500 MG tablet Take 500 mg by mouth daily.   10/26/2023 at  8:00 AM   Melatonin 10 MG TABS Take 10 mg by mouth at bedtime.   10/25/2023 at  8:00 PM   metFORMIN (GLUCOPHAGE) 1000 MG tablet Take 1 tablet (1,000 mg total) by mouth 2 (two) times daily with a meal. 180 tablet 1 10/26/2023 at  7:30 AM   metoprolol tartrate (LOPRESSOR) 25 MG tablet Take 1 tablet by mouth twice daily 180 tablet 0 10/26/2023 at  8:00 AM   ondansetron (ZOFRAN) 4 MG tablet Take 4 mg by mouth every 6 (six) hours as needed for nausea or vomiting.   Taking As Needed   sertraline (ZOLOFT) 50 MG tablet  Take 50 mg by mouth daily. Take 1 and half tablets by mouth once a day.   10/26/2023 at  8:00 AM   sitaGLIPtin (JANUVIA) 100 MG tablet Take 100 mg by mouth daily.   10/26/2023 at  8:00 AM   traZODone (DESYREL) 50 MG tablet Take 50 mg by mouth at bedtime.   10/25/2023 at  8:00 PM   zinc oxide 20 % ointment Apply 1 Application topically 3 (three) times daily.   10/26/2023 at  8:00 AM   Insulin Glargine (BASAGLAR KWIKPEN) 100 UNIT/ML Inject 0.12 mLs (12 Units total) into the skin daily. (Patient not taking: Reported on 10/26/2023) 15 mL 1 Not Taking   Scheduled:  bisacodyl  10 mg Oral QHS   cyanocobalamin  1,000 mcg Intramuscular Daily   Followed by   Melene Muller ON 11/04/2023] vitamin B-12  1,000 mcg Oral Daily   Gerhardt's butt cream   Topical Daily   insulin aspart  0-9 Units Subcutaneous Q4H   midodrine  5 mg Oral TID WC   pantoprazole  40 mg Oral Daily   polyethylene glycol  17 g Oral Daily   potassium chloride  20 mEq Oral Once   QUEtiapine  25 mg Oral BID   Continuous: [id number], bisacodyl, dextrose, haloperidol lactate, ondansetron (ZOFRAN) IV Anti-infectives (From admission, onward)    None      Scheduled Meds:  bisacodyl  10 mg Oral QHS   cyanocobalamin  1,000 mcg Intramuscular Daily   Followed by   Melene Muller ON 11/04/2023] vitamin B-12  1,000 mcg Oral Daily   Gerhardt's butt cream   Topical Daily   insulin aspart  0-9 Units Subcutaneous Q4H   midodrine  5 mg Oral TID WC   pantoprazole  40 mg Oral Daily   polyethylene glycol  17 g Oral Daily   potassium chloride  20 mEq Oral Once   QUEtiapine  25 mg Oral BID   Continuous Infusions: PRN Meds:.acetaminophen, bisacodyl, dextrose, haloperidol lactate, ondansetron (ZOFRAN) IV   Assessment: Principal Problem:   GIB (gastrointestinal bleeding) Active Problems:   Mild cognitive impairment   Type 2 diabetes mellitus with hyperglycemia, with long-term current use of insulin (HCC)   Atrial fibrillation (HCC)  Blood loss  anemia 83 year old female with history of dementia, A-fib on Eliquis who is admitted from facility for evaluation of rectal bleeding resulting in acute blood loss anemia.  Initial workup included CT angio which revealed evidence of diverticulosis and contrast in the rectal wall.  Subsequently, underwent colonoscopy revealed sigmoid diverticulosis, as well as nonbleeding hemorrhoids with no other bleeding source identified.  Patient responded appropriately to blood transfusion.  Eliquis was resumed on 2/20 and patient was found to have a witnessed episode of small-volume rectal bleeding yesterday during the day  Colonoscopy 10/27/2023 - Excellent prep - Diverticulosis in the sigmoid colon. - Non- bleeding internal hemorrhoids. - No old or fresh blood seen. - No specimens collected.  Plan: Hematochezia:  Hemoglobin is stable post blood transfusion within last 48 hours Normal BUN/creatinine Likely source bleeding from hemorrhoids Less likely diverticular bleed because of normal hemodynamics and hemoglobin Patient is certainly at risk for rebleeding on direct anticoagulation therapy Given patient's overall medical condition, it is reasonable not to restart long-term anticoagulation if she develops another significant GI bleed Do not recommend any endoscopic evaluation at this time Resume diet as tolerated Please call GI back for any questions or concerns Dr. Servando Snare will cover from tomorrow   LOS: 5 days   Jenna Odom 10/31/2023, 10:58 AM

## 2023-10-31 NOTE — TOC Progression Note (Signed)
 Transition of Care [facility]) - Progression Note    Patient Details  Name: Jenna Odom MRN: [id number] Date of Birth: [birth year]  [facility] CM/SW Contact  Bing Quarry, RN Phone Number: 10/31/2023, 3:51 PM  Clinical Narrative:  2/23: Was readied for DC and DC Summary faxed to Tammy at facility on 10/29/22. Daughter was to transport. Had another bout of Rectal Bleeding and did not discharge as planned.    Gabriel Cirri MSN RN CM  RN Case Manager [place]  Transitions of Care Direct Dial: [phone] (Weekends Only) [facility] Main Office Phone: [phone] [facility] Fax: [phone] [email or web address]     Expected Discharge Plan: Assisted Living Barriers to Discharge: No Barriers Identified  Expected Discharge Plan and Services   Discharge Planning Services: CM Consult   Living arrangements for the past 2 months: Assisted Living Facility Expected Discharge Date: 10/30/23               DME Arranged: N/A DME Agency: NA       HH Arranged: NA HH Agency: NA         Social Determinants of Health (SDOH) Interventions SDOH Screenings   Food Insecurity: No Food Insecurity (05/29/2020)  Housing: Low Risk  (05/29/2020)  Transportation Needs: No Transportation Needs (05/29/2020)  Depression (PHQ2-9): Low Risk  (05/29/2020)  Financial Resource Strain: Low Risk  (05/29/2020)  Physical Activity: Inactive (05/29/2020)  Social Connections: Moderately Isolated (05/29/2020)  Stress: No Stress Concern Present (05/29/2020)  Tobacco Use: Low Risk  (10/26/2023)    Readmission Risk Interventions     No data to display

## 2023-10-31 NOTE — Progress Notes (Signed)
 CC: lower gi bleed Subjective: No complaints, pleasantly confused  No further episodes of bleeding  Hb stable  Objective: Vital signs in last 24 hours: Temp:  [97.6 F (36.4 C)-98.3 F (36.8 C)] 97.6 F (36.4 C) (02/23 0730) Pulse Rate:  [96-98] 96 (02/23 0730) Resp:  [16-17] 16 (02/23 0730) BP: (115-142)/(74-97) 142/97 (02/23 0730) SpO2:  [98 %-100 %] 100 % (02/23 0730) Last BM Date : 10/30/23  Intake/Output from previous day: 02/22 0701 - 02/23 0700 In: 240 [P.O.:240] Out: -  Intake/Output this shift: No intake/output data recorded.  Physical exam:  CONSTITUTIONAL: NAD.debilitated and malnourished EYES: Pupils are equal, round, Sclera are non-icteric. EARS, NOSE, MOUTH AND THROAT: The oropharynx is clear. The oral mucosa is pink and moist. Hearing is intact to voice. LYMPH NODES:  Lymph nodes in the neck are normal. RESPIRATORY:  Lungs are clear. There is normal respiratory effort, with equal breath sounds bilaterally, and without pathologic use of accessory muscles. CARDIOVASCULAR: Heart is regular without murmurs, gallops, or rubs. GI: The abdomen is  soft, nontender, and nondistended. There are no palpable masses. There is no hepatosplenomegaly. There are normal bowel sounds in all quadrants. MUSCULOSKELETAL: Normal muscle strength and tone. No cyanosis or edema.   SKIN: Turgor is good and there are no pathologic skin lesions or ulcers. NEUROLOGIC: Motor and sensation is grossly normal. Cranial nerves are grossly intact. PSYCH:  Oriented to person, disoriented to place and time. Affect is normal.  Lab Results: CBC  Recent Labs    10/30/23 1326 10/31/23 0250  WBC 5.8 5.5  HGB 8.3* 8.2*  HCT 26.1* 25.7*  PLT 188 190   BMET Recent Labs    10/30/23 0346 10/31/23 0250  NA 140 139  K 3.7 3.4*  CL 104 104  CO2 27 26  GLUCOSE 119* 139*  BUN 18 16  CREATININE 0.92 1.00  CALCIUM 8.7* 9.0   PT/INR No results for input(s): "LABPROT", "INR" in the last 72  hours. ABG No results for input(s): "PHART", "HCO3" in the last 72 hours.  Invalid input(s): "PCO2", "PO2"  Studies/Results: No results found.  Anti-infectives: Anti-infectives (From admission, onward)    None       Assessment/Plan:  Lower GI bleed. I do not believe it is coming from a hemorrhoid, no evidence of bright red blood per rectum, no evidence of ulceration or recent sequela ob bleeding on exam and stool is more melanotic which is consistent with a more proximal source.  I believe hemorrhoidectomy under GETA will carry significant risk on this debilitated pt, especially when clinician findings do not suggest this as the source of bleeding. I suggest holding anticoagulation given that re bleeding is a definitive issue for this pt ( which she has already experience)  as opposed to a possible issue  ( stroke). We will be available Discussed in detail w GI and hospitalist service  Sterling Big, MD, [facility]  10/31/2023

## 2023-10-31 NOTE — Progress Notes (Addendum)
 [facility] Progress Note  Patient: Jenna Odom    ZOX:[id number]  DOA: 10/26/2023     Date of Service: the patient was seen and examined on 10/31/2023  Chief Complaint  Patient presents with   Rectal Bleeding   Abdominal Pain   Brief hospital course: Helen Winterhalter is a 83 y.o. female with medical history significant of dementia, atrial fibrillation, type 2 diabetes, hypertension presented with rectal bleeding.  Limited history in the setting of dementia and cognitive impairment.  Patient noted of had rectal bleeding at local skilled nursing facility.  Has been present for roughly 1 week.  No reports of recent medication changes.  No reports of recent fall or trauma.  No reported chest pain, shortness of breath, nausea or vomiting.  On Eliquis in setting of atrial fibrillation.  Unclear if this has been stopped at local skilled nursing facility.  Presented to the ER afebrile, hemodynamically stable.  Satting well on room air.  White count 4.9, hemoglobin 6.6, platelets 226, creatinine 1.1, glucose 220s.  CT angio GI bleed with changes concerning for hemorrhoidal versus stercoral nidus of hematochezia.  Noted sizable stool balls in the rectum.  Positive diverticulosis without diverticulitis or evidence of diverticular hemorrhage.Dr. Servando Snare w/ GI consulted.   2/22: Patient was initially hemodynamically stable, she was discharged back to her facility and just before leaving she had another episode of bleeding per rectum.  General surgery was consulted as advised by GI due to concern from recurrence of bleeding from internal hemorrhoid.  Repeat CBC ordered  2/23: Patient did not had any more bleeding episode, during rectal exam with GI did show blood on gloves, surgery does not think that it is hemorrhoidal bleed or she is a candidate for any surgical intervention, may be banding in GI office.  Gastroenterology is recommending stopping anticoagulation as she is high risk for bleeding.  Lengthy  discussion with daughter regarding risk and benefits and we will stop Eliquis on discharge and she need to follow-up with her cardiologist for further recommendations.  Assessment and Plan: # GIB (gastrointestinal bleeding) most likely due to internal hemorrhoids # ABLA  GI consulted, s/p colonoscopy, found to have internal hemorrhoids, no active bleeding.  GI recommended general surgery consult for hemorrhoidal management if recurrent bleeding. Anemia workup, iron profile and folate within normal range.  Vitamin B12 is low Received total of 2 unit of PRBC.  Hemoglobin was 8.2 this morning. General surgery was consulted but they do not think that it is hemorrhoidal and she is also not a candidate for any surgical intervention, they are recommending banding if needed.  Gastroenterology is recommending stopping anticoagulation. -Transfuse if below 8  # Hypotension, blood pressure now started trending up. 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 twice daily Prn zyprexa for agitation  Monitor  2/21, patient remained confused and has mittens. As per [facility] patient needs to be off of mittens for 48 hours, so follow TOC for discharge planning. 2/22: Patient seems to be at baseline.  Appears pleasant and wants  to go back to her place  # Hypomagnesemia, mag repleted.  # Vitamin B12 deficiency: Started vitamin B12 1000 mcg IM injection daily during hospital stay, followed by oral supplement.  Follow-up PCP to repeat vitamin B12  level after 3 to 6 months.   Body mass index is 25.35 kg/m.  Interventions:  Diet: Carb modified diet DVT Prophylaxis: SCD, pharmacological prophylaxis contraindicated due to GI bleeding    Advance goals of care discussion: Full code  Family Communication: Discussed with daughter on phone  Disposition:  Patient is from SNF and plan was to go back to her facility, just before leaving had another episode of GI bleeding.  Subjective: Patient was seen and examined today.  No new concern.  No more bleeding per rectum.  She wants to go back to her place.  Physical Exam: General.  Frail elderly lady, in no acute distress. Pulmonary.  Lungs clear bilaterally, normal respiratory effort. CV.  Regular rate and rhythm, no JVD, rub or murmur. Abdomen.  Soft, nontender, nondistended, BS positive. CNS.  Alert and oriented .  No focal neurologic deficit. Extremities.  No edema, no cyanosis, pulses intact and symmetrical.    Vitals:   10/30/23 1124 10/30/23 1527 10/30/23 2046 10/31/23 0730  BP: (!) 104/55 (!) 142/74 115/74 (!) 142/97  Pulse: (!) 103 98 97 96  Resp: 18 16 17 16   Temp:  98.3 F (36.8 C) 98.2 F (36.8 C) 97.6 F (36.4 C)  TempSrc:  Oral Oral   SpO2: 97% 98% 98% 100%  Weight:      Height:        Intake/Output Summary (Last 24 hours) at 10/31/2023 1320 Last data filed at 10/31/2023 0744 Gross per 24 hour  Intake 240 ml  Output --  Net 240 ml   Filed Weights   10/26/23 0931  Weight: 64.9 kg    Data Reviewed: I have personally reviewed and interpreted daily labs, tele strips, imagings as discussed above. I reviewed all nursing notes, pharmacy notes, vitals, pertinent old records I have discussed plan of care as described above with RN and patient/family.  CBC: Recent Labs  Lab 10/28/23 0525 10/28/23 1711 10/29/23 0627 10/30/23 0346 10/30/23 1326 10/31/23 0250  WBC 9.3  --  8.4 7.5 5.8 5.5  HGB 8.4* 8.7* 8.1* 8.1* 8.3* 8.2*  HCT 25.6* 26.7* 24.5* 24.4*  26.1* 25.7*  MCV 84.2  --  84.2 85.6 85.3 87.1  PLT 202  --  202 189 188 190   Basic Metabolic Panel: Recent Labs  Lab 10/27/23 0406 10/28/23 0525 10/29/23 0627 10/30/23 0346 10/31/23 0250  NA 135 141 141 140 139  K 3.7 4.1 3.8 3.7 3.4*  CL 103 107 108 104 104  CO2 24 26 26 27 26   GLUCOSE 137* 165* 141* 119* 139*  BUN 19 15 15 18 16   CREATININE 0.92 1.08* 0.88 0.92 1.00  CALCIUM 8.1* 8.7* 8.5* 8.7* 9.0  MG 1.7 1.6* 1.9 1.7  --   PHOS 3.1 3.5 3.9 3.8  --     Studies: No results found.   Scheduled Meds:  bisacodyl  10 mg Oral QHS   cyanocobalamin  1,000 mcg Intramuscular Daily   Followed by   Melene Muller ON 11/04/2023] vitamin B-12  1,000 mcg Oral Daily   Gerhardt's butt cream   Topical Daily   insulin aspart  0-9 Units Subcutaneous Q4H   midodrine  5 mg Oral TID WC   pantoprazole  40 mg Oral Daily   polyethylene glycol  17 g Oral Daily   potassium chloride  20 mEq Oral Once   QUEtiapine  25 mg Oral BID   Continuous Infusions:   PRN Meds: acetaminophen, bisacodyl, dextrose, haloperidol lactate, ondansetron (ZOFRAN) IV  Time spent: 42 minutes  Author: Arnetha Courser. MD [facility] 10/31/2023 1:20 PM  To reach On-call, see care teams to locate the attending and reach out to them via [email or web address]. If 7PM-7AM, please contact night-coverage If you still have difficulty reaching the attending provider, please page the [facility] (Director on Call) for [facility] on amion for assistance.

## 2023-11-01 DIAGNOSIS — G3184 Mild cognitive impairment, so stated: Secondary | ICD-10-CM | POA: Diagnosis not present

## 2023-11-01 DIAGNOSIS — I4891 Unspecified atrial fibrillation: Secondary | ICD-10-CM | POA: Diagnosis not present

## 2023-11-01 DIAGNOSIS — K922 Gastrointestinal hemorrhage, unspecified: Secondary | ICD-10-CM | POA: Diagnosis not present

## 2023-11-01 DIAGNOSIS — D500 Iron deficiency anemia secondary to blood loss (chronic): Secondary | ICD-10-CM | POA: Diagnosis not present

## 2023-11-01 LAB — GLUCOSE, CAPILLARY
Glucose-Capillary: 120 mg/dL — ABNORMAL HIGH (ref 70–99)
Glucose-Capillary: 138 mg/dL — ABNORMAL HIGH (ref 70–99)
Glucose-Capillary: 119 mg/dL — ABNORMAL HIGH (ref 70–99)
Glucose-Capillary: 412 mg/dL — ABNORMAL HIGH (ref 70–99)
Glucose-Capillary: 220 mg/dL — ABNORMAL HIGH (ref 70–99)
Glucose-Capillary: 142 mg/dL — ABNORMAL HIGH (ref 70–99)

## 2023-11-01 LAB — BASIC METABOLIC PANEL
Anion gap: 14 (ref 5–15)
BUN: 14 mg/dL (ref 8–23)
CO2: 24 mmol/L (ref 22–32)
Calcium: 9 mg/dL (ref 8.9–10.3)
Chloride: 102 mmol/L (ref 98–111)
Creatinine, Ser: 0.98 mg/dL (ref 0.44–1.00)
GFR, Estimated: 58 mL/min — ABNORMAL LOW (ref >60)
Glucose, Bld: 159 mg/dL — ABNORMAL HIGH (ref 70–99)
Potassium: 3.5 mmol/L (ref 3.5–5.1)
Sodium: 140 mmol/L (ref 135–145)

## 2023-11-01 LAB — CBC
HCT: 26 % — ABNORMAL LOW (ref 36.0–46.0)
Hemoglobin: 8.5 g/dL — ABNORMAL LOW (ref 12.0–15.0)
MCH: 28.1 pg (ref 26.0–34.0)
MCHC: 32.7 g/dL (ref 30.0–36.0)
MCV: 86.1 fL (ref 80.0–100.0)
Platelets: 194 10*3/uL (ref 150–400)
RBC: 3.02 MIL/uL — ABNORMAL LOW (ref 3.87–5.11)
RDW: 15.2 % (ref 11.5–15.5)
WBC: 5.1 10*3/uL (ref 4.0–10.5)
nRBC: 0 % (ref 0.0–0.2)

## 2023-11-01 MED ORDER — INSULIN ASPART 100 UNIT/ML IJ SOLN
15.0000 [IU] | Freq: Once | INTRAMUSCULAR | Status: AC
  Administered 2023-11-01: 15 [IU] via SUBCUTANEOUS
  Filled 2023-11-01: qty 1

## 2023-11-01 MED ORDER — METOPROLOL TARTRATE 25 MG PO TABS
25.0000 mg | ORAL_TABLET | Freq: Two times a day (BID) | ORAL | Status: DC
  Administered 2023-11-01 – 2023-11-03 (×5): 25 mg via ORAL
  Filled 2023-11-01 (×5): qty 1

## 2023-11-01 NOTE — Plan of Care (Signed)
  Problem: Education: Goal: Ability to describe self-care measures that may prevent or decrease complications (Diabetes Survival Skills Education) will improve 11/01/2023 0326 by Evelena Asa, RN Outcome: Progressing 11/01/2023 0325 by Evelena Asa, RN Outcome: Progressing  Problem: Coping: Goal: Ability to adjust to condition or change in health will improve 11/01/2023 0326 by Evelena Asa, RN Outcome: Progressing 11/01/2023 0325 by Evelena Asa, RN Outcome: Progressing   Problem: Fluid Volume: Goal: Ability to maintain a balanced intake and output will improve 11/01/2023 0326 by Evelena Asa, RN Outcome: Progressing 11/01/2023 0325 by Evelena Asa, RN Outcome: Progressing   Problem: Health Behavior/Discharge Planning: Goal: Ability to identify and utilize available resources and services will improve 11/01/2023 0326 by Evelena Asa, RN Outcome: Progressing 11/01/2023 0325 by Evelena Asa, RN Outcome: Progressing Goal: Ability to manage health-related needs will improve 11/01/2023 0326 by Evelena Asa, RN Outcome: Progressing 11/01/2023 0325 by Evelena Asa, RN Outcome: Progressing   Problem: Metabolic: Goal: Ability to maintain appropriate glucose levels will improve 11/01/2023 0326 by Evelena Asa, RN Outcome: Progressing 11/01/2023 0325 by Evelena Asa, RN Outcome: Progressing   Problem: Nutritional: Goal: Maintenance of adequate nutrition will improve 11/01/2023 0326 by Evelena Asa, RN Outcome: Progressing 11/01/2023 0325 by Evelena Asa, RN Outcome: Progressing Goal: Progress toward achieving an optimal weight will improve 11/01/2023 0326 by Evelena Asa, RN Outcome: Progressing 11/01/2023 0325 by Evelena Asa, RN Outcome: Progressing   Problem: Skin Integrity: Goal: Risk for impaired skin integrity will decrease 11/01/2023 0326 by Evelena Asa, RN Outcome: Progressing 11/01/2023 0325 by Evelena Asa, RN Outcome: Progressing    Problem: Tissue Perfusion: Goal: Adequacy of tissue perfusion will improve 11/01/2023 0326 by Evelena Asa, RN Outcome: Progressing 11/01/2023 0325 by Evelena Asa, RN Outcome: Progressing   Problem: Safety: Goal: Non-violent Restraint(s) 11/01/2023 0326 by Evelena Asa, RN Outcome: Progressing 11/01/2023 0325 by Evelena Asa, RN Outcome: Progressing   Problem: Education: Goal: Knowledge of General Education information will improve Description: Including pain rating scale, medication(s)/side effects and non-pharmacologic comfort measures 11/01/2023 0326 by Evelena Asa, RN Outcome: Progressing 11/01/2023 0325 by Evelena Asa, RN Outcome: Progressing   Problem: Health Behavior/Discharge Planning: Goal: Ability to manage health-related needs will improve 11/01/2023 0326 by Evelena Asa, RN Outcome: Progressing 11/01/2023 0325 by Evelena Asa, RN Outcome: Progressing

## 2023-11-01 NOTE — Progress Notes (Signed)
 [facility] Progress Note  Patient: Jenna Odom    JXB:[id number]  DOA: 10/26/2023     Date of Service: the patient was seen and examined on 11/01/2023  Chief Complaint  Patient presents with   Rectal Bleeding   Abdominal Pain   Brief hospital course: Jenna Odom is a 83 y.o. female with medical history significant of dementia, atrial fibrillation, type 2 diabetes, hypertension presented with rectal bleeding.  Limited history in the setting of dementia and cognitive impairment.  Patient noted of had rectal bleeding at local skilled nursing facility.  Has been present for roughly 1 week.  No reports of recent medication changes.  No reports of recent fall or trauma.  No reported chest pain, shortness of breath, nausea or vomiting.  On Eliquis in setting of atrial fibrillation.  Unclear if this has been stopped at local skilled nursing facility.  Presented to the ER afebrile, hemodynamically stable.  Satting well on room air.  White count 4.9, hemoglobin 6.6, platelets 226, creatinine 1.1, glucose 220s.  CT angio GI bleed with changes concerning for hemorrhoidal versus stercoral nidus of hematochezia.  Noted sizable stool balls in the rectum.  Positive diverticulosis without diverticulitis or evidence of diverticular hemorrhage.Dr. Servando Snare w/ GI consulted.   2/22: Patient was initially hemodynamically stable, she was discharged back to her facility and just before leaving she had another episode of bleeding per rectum.  General surgery was consulted as advised by GI due to concern from recurrence of bleeding from internal hemorrhoid.  Repeat CBC ordered  2/23: Patient did not had any more bleeding episode, during rectal exam with GI did show blood on gloves, surgery does not think that it is hemorrhoidal bleed or she is a candidate for any surgical intervention, may be banding in GI office.  Gastroenterology is recommending stopping anticoagulation as she is high risk for bleeding.  Lengthy  discussion with daughter regarding risk and benefits and we will stop Eliquis on discharge and she need to follow-up with her cardiologist for further recommendations.  2/24: No further workup needed per GI, Eliquis was DC after talking with daughter, apparently her facility wants her to be without telesitter for 48 hours before going back.  Hemoglobin stable at 8.5  Assessment and Plan: # GIB (gastrointestinal bleeding) most likely due to internal hemorrhoids # ABLA  GI consulted, s/p colonoscopy, found to have internal hemorrhoids, no active bleeding.  GI recommended general surgery consult for hemorrhoidal management if recurrent bleeding. Anemia workup, iron profile and folate within normal range.  Vitamin B12 is low Received total of 2 unit of PRBC.  Hemoglobin was 8.2 this morning. General surgery was consulted but they do not think that it is hemorrhoidal and she is also not a candidate for any surgical intervention, they are recommending banding if needed.  Gastroenterology is recommending stopping anticoagulation. -Transfuse if below 8  # Hypotension, blood pressure now started trending up. 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 twice daily Prn zyprexa for agitation  Monitor  2/21, patient remained  confused and has mittens. As per [facility] patient needs to be off of mittens for 48 hours, so follow TOC for discharge planning. 2/22: Patient seems to be at baseline.  Appears pleasant and wants to go back to her  place  # Hypomagnesemia, mag repleted.  # Vitamin B12 deficiency: Started vitamin B12 1000 mcg IM injection daily during hospital stay, followed by oral supplement.  Follow-up PCP to repeat vitamin B12 level after 3 to 6 months.   Body mass index is 25.35 kg/m.  Interventions:  Diet: Carb modified diet DVT Prophylaxis: SCD, pharmacological prophylaxis contraindicated due to GI bleeding    Advance goals of care discussion: Full code  Family Communication: Discussed with daughter on phone  Disposition:  Patient is from SNF and plan was to go back to her facility, just before leaving had another episode of GI bleeding.  Subjective: Patient with no new concern.  She wants to go back to her place.  Small amount of blood seen while wiping per nursing staff.  Physical Exam: General.  Frail elderly lady, in no acute distress. Pulmonary.  Lungs clear bilaterally, normal respiratory effort. CV.  Regular rate and rhythm, no JVD, rub or murmur. Abdomen.  Soft, nontender, nondistended, BS positive. CNS.  Alert and oriented .  No focal neurologic deficit. Extremities.  No edema, no cyanosis, pulses intact and symmetrical.     Vitals:   11/01/23 0728 11/01/23 0830 11/01/23 1133 11/01/23 1546  BP: 118/70 120/68 125/70 102/62  Pulse: (!) 124 93 99 90  Resp: 16 16 16 16   Temp: 98.4 F (36.9 C)   97.8 F (36.6 C)  TempSrc: Oral     SpO2: 97% 98% 100% 100%  Weight:      Height:        Intake/Output Summary (Last 24 hours) at 11/01/2023 1722 Last data filed at 11/01/2023 1020 Gross per 24 hour  Intake 240 ml  Output --  Net 240 ml   Filed Weights   10/26/23 0931  Weight: 64.9 kg    Data Reviewed: I have personally reviewed and interpreted daily labs, tele strips, imagings as discussed above. I reviewed all nursing notes, pharmacy notes, vitals, pertinent old records I have discussed plan of care as described above with RN and patient/family.  CBC: Recent Labs  Lab  10/29/23 0627 10/30/23 0346 10/30/23 1326 10/31/23 0250 11/01/23 0419  WBC 8.4 7.5 5.8 5.5 5.1  HGB 8.1* 8.1* 8.3* 8.2* 8.5*  HCT 24.5* 24.4* 26.1* 25.7* 26.0*  MCV 84.2 85.6 85.3 87.1 86.1  PLT 202 189 188 190 194   Basic Metabolic Panel: Recent Labs  Lab 10/27/23 0406 10/28/23 0525 10/29/23 0627 10/30/23 0346 10/31/23 0250 11/01/23 0419  NA 135 141 141 140 139 140  K 3.7 4.1 3.8 3.7 3.4* 3.5  CL 103 107 108 104 104 102  CO2 24 26 26 27 26 24   GLUCOSE 137* 165* 141* 119* 139* 159*  BUN 19 15 15 18 16 14   CREATININE 0.92 1.08* 0.88 0.92 1.00 0.98  CALCIUM 8.1* 8.7* 8.5* 8.7* 9.0 9.0  MG 1.7 1.6* 1.9 1.7  --   --   PHOS 3.1 3.5 3.9 3.8  --   --     Studies: No results found.   Scheduled Meds:  bisacodyl  10 mg Oral QHS   cyanocobalamin  1,000 mcg Intramuscular Daily   Followed by   Melene Muller ON 11/04/2023] vitamin B-12  1,000 mcg Oral Daily   Gerhardt's butt cream  Topical Daily   insulin aspart  0-9 Units Subcutaneous Q4H   metoprolol tartrate  25 mg Oral BID   pantoprazole  40 mg Oral Daily   polyethylene glycol  17 g Oral Daily   QUEtiapine  25 mg Oral BID   Continuous Infusions:   PRN Meds: acetaminophen, bisacodyl, dextrose, haloperidol lactate, ondansetron (ZOFRAN) IV  Time spent: 43 minutes  Author: Arnetha Courser. MD [facility] 11/01/2023 5:22 PM  To reach On-call, see care teams to locate the attending and reach out to them via [email or web address]. If 7PM-7AM, please contact night-coverage If you still have difficulty reaching the attending provider, please page the [facility] (Director on Call) for [facility] on amion for assistance.

## 2023-11-01 NOTE — Plan of Care (Signed)
   Problem: Education: Goal: Ability to describe self-care measures that may prevent or decrease complications (Diabetes Survival Skills Education) will improve Outcome: Progressing Goal: Individualized Educational Video(s) Outcome: Progressing

## 2023-11-01 NOTE — TOC Progression Note (Signed)
 Transition of Care [facility]) - Progression Note    Patient Details  Name: Jenna Odom MRN: [id number] Date of Birth: 28-Aug-1941  [facility] CM/SW Contact  Marlowe Sax, RN Phone Number: 11/01/2023, 2:43 PM  Clinical Narrative:     Darrol Angel about the patient being ready to DC, she will have to have the telesitter discontinued for 48 hours prior to returning  Expected Discharge Plan: Assisted Living Barriers to Discharge: No Barriers Identified  Expected Discharge Plan and Services   Discharge Planning Services: CM Consult   Living arrangements for the past 2 months: Assisted Living Facility Expected Discharge Date: 10/30/23               DME Arranged: N/A DME Agency: NA       HH Arranged: NA HH Agency: NA         Social Determinants of Health (SDOH) Interventions SDOH Screenings   Food Insecurity: No Food Insecurity (05/29/2020)  Housing: Low Risk  (05/29/2020)  Transportation Needs: No Transportation Needs (05/29/2020)  Depression (PHQ2-9): Low Risk  (05/29/2020)  Financial Resource Strain: Low Risk  (05/29/2020)  Physical Activity: Inactive (05/29/2020)  Social Connections: Moderately Isolated (05/29/2020)  Stress: No Stress Concern Present (05/29/2020)  Tobacco Use: Low Risk  (10/26/2023)    Readmission Risk Interventions     No data to display

## 2023-11-02 DIAGNOSIS — D500 Iron deficiency anemia secondary to blood loss (chronic): Secondary | ICD-10-CM | POA: Diagnosis not present

## 2023-11-02 DIAGNOSIS — K922 Gastrointestinal hemorrhage, unspecified: Secondary | ICD-10-CM | POA: Diagnosis not present

## 2023-11-02 DIAGNOSIS — G3184 Mild cognitive impairment, so stated: Secondary | ICD-10-CM | POA: Diagnosis not present

## 2023-11-02 DIAGNOSIS — I4891 Unspecified atrial fibrillation: Secondary | ICD-10-CM | POA: Diagnosis not present

## 2023-11-02 LAB — GLUCOSE, CAPILLARY
Glucose-Capillary: 106 mg/dL — ABNORMAL HIGH (ref 70–99)
Glucose-Capillary: 186 mg/dL — ABNORMAL HIGH (ref 70–99)
Glucose-Capillary: 188 mg/dL — ABNORMAL HIGH (ref 70–99)
Glucose-Capillary: 163 mg/dL — ABNORMAL HIGH (ref 70–99)
Glucose-Capillary: 317 mg/dL — ABNORMAL HIGH (ref 70–99)
Glucose-Capillary: 408 mg/dL — ABNORMAL HIGH (ref 70–99)
Glucose-Capillary: 111 mg/dL — ABNORMAL HIGH (ref 70–99)
Glucose-Capillary: 161 mg/dL — ABNORMAL HIGH (ref 70–99)

## 2023-11-02 MED ORDER — INSULIN ASPART 100 UNIT/ML IJ SOLN
3.0000 [IU] | Freq: Three times a day (TID) | INTRAMUSCULAR | Status: DC
  Administered 2023-11-03: 3 [IU] via SUBCUTANEOUS
  Filled 2023-11-02: qty 1

## 2023-11-02 MED ORDER — INSULIN ASPART 100 UNIT/ML IJ SOLN
0.0000 [IU] | Freq: Three times a day (TID) | INTRAMUSCULAR | Status: DC
  Administered 2023-11-03: 5 [IU] via SUBCUTANEOUS
  Filled 2023-11-02: qty 1

## 2023-11-02 MED ORDER — INSULIN ASPART 100 UNIT/ML IJ SOLN: 0.0000 [IU] | Freq: Every day | INTRAMUSCULAR | Status: DC

## 2023-11-02 MED ORDER — INSULIN ASPART 100 UNIT/ML IJ SOLN
20.0000 [IU] | Freq: Once | INTRAMUSCULAR | Status: AC
  Administered 2023-11-02: 20 [IU] via SUBCUTANEOUS
  Filled 2023-11-02: qty 1

## 2023-11-02 NOTE — Plan of Care (Signed)
  Problem: Education: Goal: Ability to describe self-care measures that may prevent or decrease complications (Diabetes Survival Skills Education) will improve Outcome: Progressing   Problem: Skin Integrity: Goal: Risk for impaired skin integrity will decrease Outcome: Progressing   Problem: Safety: Goal: Non-violent Restraint(s) Outcome: Progressing   Problem: Activity: Goal: Risk for activity intolerance will decrease Outcome: Progressing

## 2023-11-02 NOTE — Plan of Care (Signed)
  Problem: Education: Goal: Knowledge of General Education information will improve Description: Including pain rating scale, medication(s)/side effects and non-pharmacologic comfort measures Outcome: Progressing   Problem: Nutrition: Goal: Adequate nutrition will be maintained Outcome: Progressing   Problem: Pain Managment: Goal: General experience of comfort will improve and/or be controlled Outcome: Progressing   Problem: Skin Integrity: Goal: Risk for impaired skin integrity will decrease Outcome: Progressing

## 2023-11-02 NOTE — Progress Notes (Signed)
 [facility] Progress Note  Patient: Jenna Odom    ZOX:[id number]  DOA: 10/26/2023     Date of Service: the patient was seen and examined on 11/02/2023  Chief Complaint  Patient presents with   Rectal Bleeding   Abdominal Pain   Brief hospital course: Indy Prestwood is a 83 y.o. female with medical history significant of dementia, atrial fibrillation, type 2 diabetes, hypertension presented with rectal bleeding.  Limited history in the setting of dementia and cognitive impairment.  Patient noted of had rectal bleeding at local skilled nursing facility.  Has been present for roughly 1 week.  No reports of recent medication changes.  No reports of recent fall or trauma.  No reported chest pain, shortness of breath, nausea or vomiting.  On Eliquis in setting of atrial fibrillation.  Unclear if this has been stopped at local skilled nursing facility.  Presented to the ER afebrile, hemodynamically stable.  Satting well on room air.  White count 4.9, hemoglobin 6.6, platelets 226, creatinine 1.1, glucose 220s.  CT angio GI bleed with changes concerning for hemorrhoidal versus stercoral nidus of hematochezia.  Noted sizable stool balls in the rectum.  Positive diverticulosis without diverticulitis or evidence of diverticular hemorrhage.Dr. Servando Snare w/ GI consulted.   2/22: Patient was initially hemodynamically stable, she was discharged back to her facility and just before leaving she had another episode of bleeding per rectum.  General surgery was consulted as advised by GI due to concern from recurrence of bleeding from internal hemorrhoid.  Repeat CBC ordered  2/23: Patient did not had any more bleeding episode, during rectal exam with GI did show blood on gloves, surgery does not think that it is hemorrhoidal bleed or she is a candidate for any surgical intervention, may be banding in GI office.  Gastroenterology is recommending stopping anticoagulation as she is high risk for bleeding.  Lengthy  discussion with daughter regarding risk and benefits and we will stop Eliquis on discharge and she need to follow-up with her cardiologist for further recommendations.  2/24: No further workup needed per GI, Eliquis was DC after talking with daughter, apparently her facility wants her to be without telesitter for 48 hours before going back.  Hemoglobin stable at 8.5  2/25: Remained hemodynamically stable, no more active bleeding.  Likely can go back to her facility tomorrow as we discontinued TeleSitter yesterday. We will stop anticoagulation on discharge  Assessment and Plan: # GIB (gastrointestinal bleeding) most likely due to internal hemorrhoids # ABLA  GI consulted, s/p colonoscopy, found to have internal hemorrhoids, no active bleeding.  GI recommended general surgery consult for hemorrhoidal management if recurrent bleeding. Anemia workup, iron profile and folate within normal range.  Vitamin B12 is low Received total of 2 unit of PRBC.  Hemoglobin was 8.2 this morning. General surgery was consulted but they do not think that it is hemorrhoidal and she is also not a candidate for any surgical intervention, they are recommending banding if needed.  Gastroenterology is recommending stopping anticoagulation. -Transfuse if below 8  # Hypotension, blood pressure now started trending up. -Stop midodrine -Continue to monitor  Atrial fibrillation (HCC) Baseline atrial fibrillation on eliquis  2/20 H&H stable, Eliquis was resumed 2/22: Again holding for concern of recurrence of bleeding 2/25: We will stop anticoagulation on discharge  # Type 2 diabetes mellitus with hyperglycemia, with long-term current use of insulin (HCC) Blood sugar 200s  SSI  Monitor      # Mild cognitive impairment # Severe Dementia,  Baseline end stage dementia w/ cognitive impairment  + confusion at the bedside- unclear of baseline  2/19 started  Seroquel 25 mg p.o. twice daily Prn zyprexa for agitation   Monitor  2/21, patient remained confused and has mittens. As per [facility] patient needs to be off of mittens for 48 hours, so follow TOC for discharge planning. 2/22: Patient seems to be at baseline.  Appears pleasant and wants to go back to her place  # Hypomagnesemia, mag repleted.  # Vitamin B12 deficiency: Started vitamin B12 1000 mcg IM injection daily during hospital stay, followed by oral supplement.  Follow-up PCP to repeat vitamin B12 level after 3 to 6 months.   Body mass index is 25.35 kg/m.  Interventions:  Diet: Carb modified diet DVT Prophylaxis: SCD, pharmacological prophylaxis contraindicated due to GI bleeding    Advance goals of care discussion: Full code  Family Communication: Discussed with daughter on phone  Disposition:  Patient is from SNF and plan was to go back to her facility, just before leaving had another episode of GI bleeding.  Subjective: Patient was eating breakfast when seen today.  No new concern.  No more active bleeding.  Physical Exam: General.  Frail elderly lady, in no acute distress. Pulmonary.  Lungs clear bilaterally, normal respiratory effort. CV.  Regular rate and rhythm, no JVD, rub or murmur. Abdomen.  Soft, nontender, nondistended, BS positive. CNS.  Alert and oriented to self.  No focal neurologic deficit. Extremities.  No edema, no cyanosis, pulses intact and symmetrical.     Vitals:   11/01/23 1546 11/01/23 2038 11/02/23 0741 11/02/23 1428  BP: 102/62 97/61 124/77 (!) 106/54  Pulse: 90 94 92 (!) 53  Resp: 16 18 16 15   Temp: 97.8 F (36.6 C) (!) 97.3 F (36.3 C) 98.1 F (36.7 C) 97.7 F (36.5 C)  TempSrc:      SpO2: 100% 100% 100% 100%  Weight:      Height:       No intake or output data in the 24 hours ending 11/02/23 1552  Filed Weights   10/26/23 0931  Weight: 64.9 kg    Data Reviewed: I have personally reviewed and interpreted daily labs, tele strips, imagings as discussed above. I reviewed all nursing  notes, pharmacy notes, vitals, pertinent old records I have discussed plan of care as described above with RN and patient/family.  CBC: Recent Labs  Lab 10/29/23 0627 10/30/23 0346 10/30/23 1326 10/31/23 0250 11/01/23 0419  WBC 8.4 7.5 5.8 5.5 5.1  HGB 8.1* 8.1* 8.3* 8.2* 8.5*  HCT 24.5* 24.4* 26.1* 25.7* 26.0*  MCV 84.2 85.6 85.3 87.1 86.1  PLT 202 189 188 190 194   Basic Metabolic Panel: Recent Labs  Lab 10/27/23 0406 10/28/23 0525 10/29/23 0627 10/30/23 0346 10/31/23 0250 11/01/23 0419  NA 135 141 141 140 139 140  K 3.7 4.1 3.8 3.7 3.4* 3.5  CL 103 107 108 104 104 102  CO2 24 26 26 27 26 24   GLUCOSE 137* 165* 141* 119* 139* 159*  BUN 19 15 15 18 16 14   CREATININE 0.92 1.08* 0.88 0.92 1.00 0.98  CALCIUM 8.1* 8.7* 8.5* 8.7* 9.0 9.0  MG 1.7 1.6* 1.9 1.7  --   --   PHOS 3.1 3.5 3.9 3.8  --   --     Studies: No results found.   Scheduled Meds:  bisacodyl  10 mg Oral QHS   cyanocobalamin  1,000 mcg Intramuscular Daily   Followed by   [  START ON 11/04/2023] vitamin B-12  1,000 mcg Oral Daily   Gerhardt's butt cream   Topical Daily   insulin aspart  0-15 Units Subcutaneous TID WC   insulin aspart  0-5 Units Subcutaneous QHS   insulin aspart  3 Units Subcutaneous TID WC   metoprolol tartrate  25 mg Oral BID   pantoprazole  40 mg Oral Daily   polyethylene glycol  17 g Oral Daily   QUEtiapine  25 mg Oral BID   Continuous Infusions:   PRN Meds: acetaminophen, bisacodyl, dextrose, haloperidol lactate, ondansetron (ZOFRAN) IV  Time spent: 40 minutes  Author: Arnetha Courser. MD [facility] 11/02/2023 3:52 PM  To reach On-call, see care teams to locate the attending and reach out to them via [email or web address]. If 7PM-7AM, please contact night-coverage If you still have difficulty reaching the attending provider, please page the [facility] (Director on Call) for [facility] on amion for assistance.

## 2023-11-03 DIAGNOSIS — I4891 Unspecified atrial fibrillation: Secondary | ICD-10-CM | POA: Diagnosis not present

## 2023-11-03 DIAGNOSIS — D500 Iron deficiency anemia secondary to blood loss (chronic): Secondary | ICD-10-CM | POA: Diagnosis not present

## 2023-11-03 DIAGNOSIS — Z7901 Long term (current) use of anticoagulants: Secondary | ICD-10-CM

## 2023-11-03 DIAGNOSIS — K922 Gastrointestinal hemorrhage, unspecified: Secondary | ICD-10-CM | POA: Diagnosis not present

## 2023-11-03 LAB — GLUCOSE, CAPILLARY
Glucose-Capillary: 236 mg/dL — ABNORMAL HIGH (ref 70–99)
Glucose-Capillary: 265 mg/dL — ABNORMAL HIGH (ref 70–99)

## 2023-11-03 LAB — CBC
HCT: 26.6 % — ABNORMAL LOW (ref 36.0–46.0)
Hemoglobin: 8.2 g/dL — ABNORMAL LOW (ref 12.0–15.0)
MCH: 27.8 pg (ref 26.0–34.0)
MCHC: 30.8 g/dL (ref 30.0–36.0)
MCV: 90.2 fL (ref 80.0–100.0)
Platelets: 173 10*3/uL (ref 150–400)
RBC: 2.95 MIL/uL — ABNORMAL LOW (ref 3.87–5.11)
RDW: 15 % (ref 11.5–15.5)
WBC: 5.8 10*3/uL (ref 4.0–10.5)
nRBC: 0 % (ref 0.0–0.2)

## 2023-11-03 MED ORDER — HALOPERIDOL LACTATE 5 MG/ML IJ SOLN: 2.0000 mg | Freq: Four times a day (QID) | INTRAMUSCULAR | Status: DC | PRN

## 2023-11-03 NOTE — Plan of Care (Signed)
  Problem: Coping: Goal: Ability to adjust to condition or change in health will improve Outcome: Progressing   Problem: Fluid Volume: Goal: Ability to maintain a balanced intake and output will improve Outcome: Progressing   Problem: Health Behavior/Discharge Planning: Goal: Ability to identify and utilize available resources and services will improve Outcome: Progressing Goal: Ability to manage health-related needs will improve Outcome: Progressing   Problem: Metabolic: Goal: Ability to maintain appropriate glucose levels will improve Outcome: Progressing   Problem: Nutritional: Goal: Maintenance of adequate nutrition will improve Outcome: Progressing Goal: Progress toward achieving an optimal weight will improve Outcome: Progressing   Problem: Skin Integrity: Goal: Risk for impaired skin integrity will decrease Outcome: Progressing   Problem: Tissue Perfusion: Goal: Adequacy of tissue perfusion will improve Outcome: Progressing   Problem: Safety: Goal: Non-violent Restraint(s) Outcome: Progressing   Problem: Health Behavior/Discharge Planning: Goal: Ability to manage health-related needs will improve Outcome: Progressing   Problem: Clinical Measurements: Goal: Ability to maintain clinical measurements within normal limits will improve Outcome: Progressing Goal: Will remain free from infection Outcome: Progressing Goal: Diagnostic test results will improve Outcome: Progressing Goal: Respiratory complications will improve Outcome: Progressing Goal: Cardiovascular complication will be avoided Outcome: Progressing   Problem: Activity: Goal: Risk for activity intolerance will decrease Outcome: Progressing   Problem: Nutrition: Goal: Adequate nutrition will be maintained Outcome: Progressing   Problem: Coping: Goal: Level of anxiety will decrease Outcome: Progressing   Problem: Elimination: Goal: Will not experience complications related to bowel  motility Outcome: Progressing Goal: Will not experience complications related to urinary retention Outcome: Progressing   Problem: Pain Managment: Goal: General experience of comfort will improve and/or be controlled Outcome: Progressing   Problem: Safety: Goal: Ability to remain free from injury will improve Outcome: Progressing   Problem: Skin Integrity: Goal: Risk for impaired skin integrity will decrease Outcome: Progressing

## 2023-11-03 NOTE — Discharge Summary (Signed)
 Physician Discharge Summary   Patient: Jenna Odom MRN: [id number] DOB: Apr 08, 1941  Admit date:     10/26/2023  Discharge date: 11/03/23  Discharge Physician: Arnetha Courser   PCP: Galvin Proffer, MD   Recommendations at discharge:  Please obtain CBC and BMP on follow-up If patient experience more hemorrhoidal bleeding-will need surgical evaluation for definitive treatment Eliquis has been discontinued after discussing with daughter, please follow-up with cardiology for further assistance. Please avoid constipation Follow-up with primary care provider within a week.  Discharge Diagnoses: Principal Problem:   GIB (gastrointestinal bleeding) Active Problems:   Mild cognitive impairment   Type 2 diabetes mellitus with hyperglycemia, with long-term current use of insulin (HCC)   Atrial fibrillation (HCC)   Blood loss anemia   Hematochezia   Hospital Course: Jenna Odom is a 83 y.o. female with medical history significant of dementia, atrial fibrillation, type 2 diabetes, hypertension presented with rectal bleeding.  Limited history in the setting of dementia and cognitive impairment.  Patient noted of had rectal bleeding at local skilled nursing facility.  Has been present for roughly 1 week.  No reports of recent medication changes.  No reports of recent fall or trauma.  No reported chest pain, shortness of breath, nausea or vomiting.  On Eliquis in setting of atrial fibrillation.   On presentation hemodynamically stable, hemoglobin of 6.6. CT angio GI bleed with changes concerning for hemorrhoidal versus stercoral nidus of hematochezia. Noted sizable stool balls in the rectum. Positive diverticulosis without diverticulitis or evidence of diverticular hemorrhage.   Patient received total of 2 units of PRBC, hemoglobin on the day of discharge was 8.1.  Anemia panel with concern of B12 deficiency so she was started on supplement.  GI was consulted and patient underwent  colonoscopy,found to have internal hemorrhoids, no active bleeding. GI recommended general surgery consult for hemorrhoidal management if recurrent bleeding.   Her home Eliquis was resumed before discharge.  Patient did not had any more bleeding.  She should avoid constipation.  Patient has severe dementia and cognitive impairment.  Mentation seems to be at baseline and patient is being discharged back to her facility.  Patient is on metoprolol for rate control, due to softer blood pressure initially metoprolol was held and she was started on midodrine.  Blood pressure remained borderline so we will continue metoprolol with midodrine.  Midodrine should be held if systolic blood pressure go above 130.  Patient had another episode of bleeding, general surgery and GI was again consulted.  General surgery does not think that she is having hemorrhoidal bleed, they are commending banding if needed instead of surgical intervention due to her underlying comorbidities and frailty.  GI does not think that she need another procedure at this time.  They are advising to discontinue anticoagulation.  Eliquis was discontinued after discussing with daughter about the risk and benefits.  She agreed to stopping anticoagulation and observing for now.  Patient need to follow-up with her cardiologist for further assistance if she develop more episodes of A-fib. Hemoglobin at 8.2 and she did not had any more active bleeding at this time.  Patient will continue on current medications and need to have a close follow-up with her providers for further management.  Consultants: Gastroenterology Procedures performed: Colonoscopy Disposition: Skilled nursing facility Diet recommendation:  Discharge Diet Orders (From admission, onward)     Start     Ordered   10/30/23 0000  Diet - low sodium heart healthy  10/30/23 1237           Cardiac and Carb modified diet DISCHARGE MEDICATION: Allergies as of 11/03/2023        Reactions   Ciprofloxacin Nausea And Vomiting        Medication List     STOP taking these medications    Eliquis 2.5 MG Tabs tablet Generic drug: apixaban       TAKE these medications    acetaminophen 500 MG tablet Commonly known as: TYLENOL Take 500 mg by mouth in the morning and at bedtime.   brimonidine 0.2 % ophthalmic solution Commonly known as: ALPHAGAN Place 1 drop into both eyes in the morning and at bedtime.   cyanocobalamin 1000 MCG tablet Take 1 tablet (1,000 mcg total) by mouth daily. Start taking on: November 04, 2023   Hemorrhoidal 0.25-14-74.9 % rectal ointment Generic drug: phenylephrine-shark liver oil-mineral oil-petrolatum Place 1 Application rectally every 6 (six) hours as needed for hemorrhoids.   insulin glargine 100 UNIT/ML injection Commonly known as: LANTUS Inject 6-8 Units into the skin in the morning and at bedtime. Injects 8 units sub in the morning and 6 units at bedtime. What changed: Another medication with the same name was removed. Continue taking this medication, and follow the directions you see here.   latanoprost 0.005 % ophthalmic solution Commonly known as: XALATAN Place 1 drop into both eyes at bedtime.   LORazepam 0.5 MG tablet Commonly known as: ATIVAN Take 0.5 mg by mouth daily as needed for anxiety.   magnesium gluconate 500 MG tablet Commonly known as: MAGONATE Take 500 mg by mouth daily.   Melatonin 10 MG Tabs Take 10 mg by mouth at bedtime.   metFORMIN 1000 MG tablet Commonly known as: GLUCOPHAGE Take 1 tablet (1,000 mg total) by mouth 2 (two) times daily with a meal.   metoprolol tartrate 25 MG tablet Commonly known as: LOPRESSOR Take 1 tablet by mouth twice daily   midodrine 5 MG tablet Commonly known as: PROAMATINE Take 1 tablet (5 mg total) by mouth 3 (three) times daily with meals. Please hold if systolic above 130   ondansetron 4 MG tablet Commonly known as: ZOFRAN Take 4 mg by mouth  every 6 (six) hours as needed for nausea or vomiting.   pantoprazole 40 MG tablet Commonly known as: PROTONIX Take 1 tablet (40 mg total) by mouth daily.   polyethylene glycol 17 g packet Commonly known as: MIRALAX / GLYCOLAX Take 17 g by mouth daily as needed.   sertraline 50 MG tablet Commonly known as: ZOLOFT Take 50 mg by mouth daily. Take 1 and half tablets by mouth once a day.   sitaGLIPtin 100 MG tablet Commonly known as: JANUVIA Take 100 mg by mouth daily.   traZODone 50 MG tablet Commonly known as: DESYREL Take 50 mg by mouth at bedtime.   Vitamin D3 50 MCG (2000 UT) Chew Chew 50 mcg by mouth daily.   zinc oxide 20 % ointment Apply 1 Application topically 3 (three) times daily.        Follow-up Information     Hague, Myrene Galas, MD. Schedule an appointment as soon as possible for a visit in 1 week(s).   Specialty: Internal Medicine Contact information: [address] [place] [address] [phone]                Discharge Exam: Filed Weights   10/26/23 0931  Weight: 64.9 kg   General.  Frail elderly lady, in no acute distress.  Pulmonary.  Lungs clear bilaterally, normal respiratory effort. CV.  Regular rate and rhythm, no JVD, rub or murmur. Abdomen.  Soft, nontender, nondistended, BS positive. CNS.  Alert and oriented .  No focal neurologic deficit. Extremities.  No edema, no cyanosis, pulses intact and symmetrical.  Condition at discharge: stable  The results of significant diagnostics from this hospitalization (including imaging, microbiology, ancillary and laboratory) are listed below for reference.   Imaging Studies: DG Abdomen 1 View Result Date: 10/26/2023 CLINICAL DATA:  NG tube repositioning. EXAM: ABDOMEN - 1 VIEW COMPARISON:  Earlier today FINDINGS: Tip and side port of the enteric tube are now located below the diaphragm in the stomach. Nonobstructive upper abdominal bowel gas pattern. IMPRESSION: Tip and side port of the enteric tube  are now located below the diaphragm in the stomach. Electronically Signed   By: Narda Rutherford M.D.   On: 10/26/2023 22:00   DG Abdomen 1 View Result Date: 10/26/2023 CLINICAL DATA:  NG tube placement. Patient pulled out NG tube, subsequently replaced. EXAM: ABDOMEN - 1 VIEW COMPARISON:  Earlier today.  Subsequent radiograph also reviewed. FINDINGS: The enteric tube is looped in the distal esophagus, tip directed cranially not included in the field of view. This has been subsequently adjusted. No bowel dilatation in the upper abdomen. IMPRESSION: Enteric tube looped in the distal esophagus, tip directed cranially not included in the field of view. This has been subsequently adjusted. Electronically Signed   By: Narda Rutherford M.D.   On: 10/26/2023 21:57   DG Abdomen 1 View Result Date: 10/26/2023 CLINICAL DATA:  NG placement. EXAM: ABDOMEN - 1 VIEW COMPARISON:  CT abdomen pelvis dated 10/26/2023. FINDINGS: Enteric tube with side port and tip in the left upper abdomen in the proximal stomach. No bowel dilatation or evidence of obstruction. Excreted contrast noted in the urinary bladder. Osteopenia with degenerative changes of spine. No acute osseous pathology. IMPRESSION: Enteric tube with tip in the proximal stomach. Electronically Signed   By: Elgie Collard M.D.   On: 10/26/2023 19:11   CT ANGIO GI BLEED Result Date: 10/26/2023 CLINICAL DATA:  Lower abdominal pain, hematochezia, blood loss anemia requiring transfusion, rectal bleeding for 1 week EXAM: CTA ABDOMEN AND PELVIS WITHOUT AND WITH CONTRAST TECHNIQUE: Multidetector CT imaging of the abdomen and pelvis was performed using the standard protocol during bolus administration of intravenous contrast. Multiplanar reconstructed images and MIPs were obtained and reviewed to evaluate the vascular anatomy. RADIATION DOSE REDUCTION: This exam was performed according to the departmental dose-optimization program which includes automated exposure  control, adjustment of the mA and/or kV according to patient size and/or use of iterative reconstruction technique. CONTRAST:  OMNIPAQUE IOHEXOL 350 MG/ML SOLN COMPARISON:  None Available. FINDINGS: VASCULAR Severe aortic atherosclerosis. Normal contour and caliber of the abdominal aorta. No evidence of aneurysm, dissection, or other acute aortic pathology. Standard branching pattern of the abdominal aorta with solitary bilateral renal arteries. Review of the MIP images confirms the above findings. NON-VASCULAR Lower Chest: No acute findings.  Coronary artery calcifications. Hepatobiliary: No solid liver abnormality is seen. Tiny gallstones. No gallbladder wall thickening, or biliary dilatation. Pancreas: Unremarkable. No pancreatic ductal dilatation or surrounding inflammatory changes. Spleen: Normal in size without significant abnormality. Adrenals/Urinary Tract: Adrenal glands are unremarkable. Lobulated renal cortical scarring. Kidneys are otherwise normal, without renal calculi, solid lesion, or hydronephrosis. Bladder is unremarkable. Stomach/Bowel: Stomach is within normal limits. Large periampullary duodenal diverticulum hyperdense ingested material within the gastric lumen on precontrast images. Appendix not  clearly visualized. No evidence of bowel wall thickening, distention, or inflammatory changes. Sigmoid diverticula. Small volume of contrast within the rectal vault on venous phase imaging (series 17, image 199, 206). Sizable stool balls in the rectum. Lymphatic: No enlarged abdominal or pelvic lymph nodes. Reproductive: Status post hysterectomy. Other: No abdominal wall hernia or abnormality. No ascites. Musculoskeletal: No acute osseous findings. IMPRESSION: 1. Small volume of contrast within the rectal vault on venous phase imaging, suggestive of hemorrhoidal or stercoral nidus of hematochezia. Sizable stool balls in the rectum. 2. Sigmoid diverticulosis without evidence of acute diverticulitis  or specific evidence of diverticular hemorrhage. 3. Cholelithiasis without evidence of acute cholecystitis. 4. Coronary artery disease. 5. Severe aortic atherosclerosis. Aortic Atherosclerosis (ICD10-I70.0). Electronically Signed   By: Jearld Lesch M.D.   On: 10/26/2023 11:47    Microbiology: Results for orders placed or performed during the hospital encounter of 06/26/20  Urine Culture     Status: Abnormal   Collection Time: 06/26/20  8:35 AM   Specimen: Urine, Random  Result Value Ref Range Status   Specimen Description   Final    URINE, RANDOM Performed at [facility], [address] [place], [place] [address]    Special Requests   Final    NONE Performed at [facility], [address], [place], [place] [address]    Culture >=100,000 COLONIES/mL PROTEUS MIRABILIS (A)  Final   Report Status 06/29/2020 FINAL  Final   Organism ID, Bacteria PROTEUS MIRABILIS (A)  Final      Susceptibility   Proteus mirabilis - MIC*    AMPICILLIN <=2 SENSITIVE Sensitive     CEFAZOLIN <=4 SENSITIVE Sensitive     CEFTRIAXONE <=0.25 SENSITIVE Sensitive     CIPROFLOXACIN <=0.25 SENSITIVE Sensitive     GENTAMICIN <=1 SENSITIVE Sensitive     IMIPENEM 1 SENSITIVE Sensitive     NITROFURANTOIN 128 RESISTANT Resistant     TRIMETH/SULFA <=20 SENSITIVE Sensitive     AMPICILLIN/SULBACTAM <=2 SENSITIVE Sensitive     PIP/TAZO <=4 SENSITIVE Sensitive     * >=100,000 COLONIES/mL PROTEUS MIRABILIS    Labs: CBC: Recent Labs  Lab 10/30/23 0346 10/30/23 1326 10/31/23 0250 11/01/23 0419 11/03/23 0557  WBC 7.5 5.8 5.5 5.1 5.8  HGB 8.1* 8.3* 8.2* 8.5* 8.2*  HCT 24.4* 26.1* 25.7* 26.0* 26.6*  MCV 85.6 85.3 87.1 86.1 90.2  PLT 189 188 190 194 173   Basic Metabolic Panel: Recent Labs  Lab 10/28/23 0525 10/29/23 0627 10/30/23 0346 10/31/23 0250 11/01/23 0419  NA 141 141 140 139 140  K 4.1 3.8 3.7 3.4* 3.5  CL 107 108 104 104 102  CO2 26 26 27 26 24   GLUCOSE 165* 141* 119*  139* 159*  BUN 15 15 18 16 14   CREATININE 1.08* 0.88 0.92 1.00 0.98  CALCIUM 8.7* 8.5* 8.7* 9.0 9.0  MG 1.6* 1.9 1.7  --   --   PHOS 3.5 3.9 3.8  --   --    Liver Function Tests: No results for input(s): "AST", "ALT", "ALKPHOS", "BILITOT", "PROT", "ALBUMIN" in the last 168 hours.  CBG: Recent Labs  Lab 11/02/23 1135 11/02/23 1348 11/02/23 1655 11/02/23 2029 11/03/23 0901  GLUCAP 317* 408* 111* 161* 236*    Discharge time spent: greater than 30 minutes.  This record has been created using Conservation officer, historic buildings. Errors have been sought and corrected,but may not always be located. Such creation errors do not reflect on the standard of care.  Signed: Arnetha Courser, MD [facility] 11/03/2023

## 2023-11-03 NOTE — Progress Notes (Signed)
 Mobility Specialist - Progress Note   11/03/23 1118  Mobility  Activity Ambulated with assistance in hallway  Level of Assistance Standby assist, set-up cues, supervision of patient - no hands on  Assistive Device Front wheel walker  Distance Ambulated (ft) 480 ft  Activity Response Tolerated well  Mobility visit 1 Mobility  Mobility Specialist Start Time (ACUTE ONLY) 1023  Mobility Specialist Stop Time (ACUTE ONLY) 1029  Mobility Specialist Time Calculation (min) (ACUTE ONLY) 6 min   Pt sitting in the recliner near the NS upon arrival, utilizing RA. Pt agreeable to amb within the hallway this date. Pt STS to RW and amb three laps around the NS with supervision--- requiring only cuing for redirection. Pt returned to the recliner, left seated and RN notified of Pt's return.   Zetta Bills Mobility Specialist 11/03/23 11:20 AM

## 2023-11-03 NOTE — Progress Notes (Signed)
 1115 Attempted to call report to nurse at [facility]. No answer will attempt again later.

## 2023-11-03 NOTE — Progress Notes (Signed)
 1153 Report given to Tammy at [facility]. All questions answered. Belongings returned to pt

## 2023-11-03 NOTE — TOC Progression Note (Addendum)
 Transition of Care [facility]) - Progression Note    Patient Details  Name: Jenna Odom MRN: [id number] Date of Birth: 1941/06/22  Transition of Care [facility]) CM/SW Contact  Marlowe Sax, RN Phone Number: 11/03/2023, 11:04 AM  Clinical Narrative:     Sherron Monday with Tammy at [facility], they will accept the patient today, I attempted to reach her daughter Inetta Fermo to transport and got a VM, I left a HIPAA compliant VM asking for a return call Was able to speak to daughter Inetta Fermo, she is at work and wants [facility] to pick her up I notified [facility]  I faxed DC information to Tammy at [phone] Expected Discharge Plan: Assisted Living Barriers to Discharge: No Barriers Identified  Expected Discharge Plan and Services   Discharge Planning Services: CM Consult   Living arrangements for the past 2 months: Assisted Living Facility Expected Discharge Date: 11/03/23               DME Arranged: N/A DME Agency: NA       HH Arranged: NA HH Agency: NA         Social Determinants of Health (SDOH) Interventions SDOH Screenings   Food Insecurity: No Food Insecurity (05/29/2020)  Housing: Low Risk  (05/29/2020)  Transportation Needs: No Transportation Needs (05/29/2020)  Depression (PHQ2-9): Low Risk  (05/29/2020)  Financial Resource Strain: Low Risk  (05/29/2020)  Physical Activity: Inactive (05/29/2020)  Social Connections: Moderately Isolated (05/29/2020)  Stress: No Stress Concern Present (05/29/2020)  Tobacco Use: Low Risk  (10/26/2023)    Readmission Risk Interventions     No data to display

## 2023-11-03 NOTE — NC FL2 (Addendum)
 [place] MEDICAID FL2 LEVEL OF CARE FORM     IDENTIFICATION  Patient Name: Jenna Odom Birthdate: 1941/02/05 Sex: female Admission Date (Current Location): 10/26/2023  [place] and [place] Number:  [address] [id number] [facility] Facility and Address:  [facility], [address], [place], [place] [address]      Provider Number: [id number]  Attending Physician Name and Address:  Arnetha Courser, MD  Relative Name and Phone Number:  Bennette Hasty  Daughter  Emergency Contact  [phone]    Current Level of Care: Hospital Recommended Level of Care: Assisted Living Facility Prior Approval Number:    Date Approved/Denied:   PASRR Number:    Discharge Plan: Other (Comment) (ALF)    Current Diagnoses: Patient Active Problem List   Diagnosis Date Noted   Hematochezia 10/31/2023   GIB (gastrointestinal bleeding) 10/26/2023   Atrial fibrillation (HCC) 10/26/2023   Blood loss anemia 10/26/2023   Benign essential hypertension 02/18/2020   Type 2 diabetes mellitus with hyperglycemia, with long-term current use of insulin (HCC) 02/28/2019   Mild cognitive impairment 04/06/2018    Orientation RESPIRATION BLADDER Height & Weight     Self  Normal Continent Weight: 64.9 kg Height:  5\' 3"  (160 cm)  BEHAVIORAL SYMPTOMS/MOOD NEUROLOGICAL BOWEL NUTRITION STATUS      Continent Diet (regular)  AMBULATORY STATUS COMMUNICATION OF NEEDS Skin   Supervision Verbally Normal                       Personal Care Assistance Level of Assistance  Bathing, Feeding, Dressing Bathing Assistance: Limited assistance Feeding assistance: Independent Dressing Assistance: Limited assistance     Functional Limitations Info  Sight, Hearing, Speech Sight Info: Adequate Hearing Info: Adequate Speech Info: Adequate    SPECIAL CARE FACTORS FREQUENCY                       Contractures Contractures Info: Not present    Additional Factors Info  Code Status,  Allergies Code Status Info: Full code Allergies Info: Ciproflaxin           Current Medications (11/03/2023):  This is the current hospital active medication list Current Facility-Administered Medications  Medication Dose Route Frequency Provider Last Rate Last Admin   acetaminophen (TYLENOL) tablet 650 mg  650 mg Oral Q6H PRN Gillis Santa, MD   650 mg at 10/31/23 1755   bisacodyl (DULCOLAX) EC tablet 10 mg  10 mg Oral QHS Gillis Santa, MD   10 mg at 11/02/23 2123   bisacodyl (DULCOLAX) suppository 10 mg  10 mg Rectal Daily PRN Gillis Santa, MD       cyanocobalamin (VITAMIN B12) injection 1,000 mcg  1,000 mcg Intramuscular Daily Gillis Santa, MD   1,000 mcg at 11/02/23 1019   Followed by   Melene Muller ON 11/04/2023] cyanocobalamin (VITAMIN B12) tablet 1,000 mcg  1,000 mcg Oral Daily Gillis Santa, MD       dextrose 50 % solution 12.5 g  12.5 g Intravenous PRN Gillis Santa, MD       Gerhardt's butt cream   Topical Daily Jawo, Modou L, NP   Given at 11/03/23 0902   haloperidol lactate (HALDOL) injection 2 mg  2 mg Intramuscular Q6H PRN Jawo, Modou L, NP       insulin aspart (novoLOG) injection 0-15 Units  0-15 Units Subcutaneous TID WC Arnetha Courser, MD   5 Units at 11/03/23 0908   insulin aspart (novoLOG) injection 0-5 Units  0-5 Units Subcutaneous  Lind Covert, MD       insulin aspart (novoLOG) injection 3 Units  3 Units Subcutaneous TID WC Arnetha Courser, MD   3 Units at 11/03/23 0908   metoprolol tartrate (LOPRESSOR) tablet 25 mg  25 mg Oral BID Arnetha Courser, MD   25 mg at 11/03/23 0902   ondansetron (ZOFRAN) injection 4 mg  4 mg Intravenous Q6H PRN Gillis Santa, MD       pantoprazole (PROTONIX) EC tablet 40 mg  40 mg Oral Daily Gillis Santa, MD   40 mg at 11/03/23 0902   polyethylene glycol (MIRALAX / GLYCOLAX) packet 17 g  17 g Oral Daily Gillis Santa, MD   17 g at 11/03/23 0902   QUEtiapine (SEROQUEL) tablet 25 mg  25 mg Oral BID Gillis Santa, MD   25 mg at 11/03/23 0902      Discharge Medications: acetaminophen 500 MG tablet Commonly known as: TYLENOL Take 500 mg by mouth in the morning and at bedtime.    brimonidine 0.2 % ophthalmic solution Commonly known as: ALPHAGAN Place 1 drop into both eyes in the morning and at bedtime.    cyanocobalamin 1000 MCG tablet Take 1 tablet (1,000 mcg total) by mouth daily. Start taking on: November 04, 2023    Hemorrhoidal 0.25-14-74.9 % rectal ointment Generic drug: phenylephrine-shark liver oil-mineral oil-petrolatum Place 1 Application rectally every 6 (six) hours as needed for hemorrhoids.    insulin glargine 100 UNIT/ML injection Commonly known as: LANTUS Inject 6-8 Units into the skin in the morning and at bedtime. Injects 8 units sub in the morning and 6 units at bedtime. What changed: Another medication with the same name was removed. Continue taking this medication, and follow the directions you see here.    latanoprost 0.005 % ophthalmic solution Commonly known as: XALATAN Place 1 drop into both eyes at bedtime.    LORazepam 0.5 MG tablet Commonly known as: ATIVAN Take 0.5 mg by mouth daily as needed for anxiety.    magnesium gluconate 500 MG tablet Commonly known as: MAGONATE Take 500 mg by mouth daily.    Melatonin 10 MG Tabs Take 10 mg by mouth at bedtime.    metFORMIN 1000 MG tablet Commonly known as: GLUCOPHAGE Take 1 tablet (1,000 mg total) by mouth 2 (two) times daily with a meal.    metoprolol tartrate 25 MG tablet Commonly known as: LOPRESSOR Take 1 tablet by mouth twice daily    midodrine 5 MG tablet Commonly known as: PROAMATINE Take 1 tablet (5 mg total) by mouth 3 (three) times daily with meals. Please hold if systolic above 130    ondansetron 4 MG tablet Commonly known as: ZOFRAN Take 4 mg by mouth every 6 (six) hours as needed for nausea or vomiting.    pantoprazole 40 MG tablet Commonly known as: PROTONIX Take 1 tablet (40 mg total) by mouth daily.     polyethylene glycol 17 g packet Commonly known as: MIRALAX / GLYCOLAX Take 17 g by mouth daily as needed.    sertraline 50 MG tablet Commonly known as: ZOLOFT Take 50 mg by mouth daily. Take 1 and half tablets by mouth once a day.    sitaGLIPtin 100 MG tablet Commonly known as: JANUVIA Take 100 mg by mouth daily.    traZODone 50 MG tablet Commonly known as: DESYREL Take 50 mg by mouth at bedtime.    Vitamin D3 50 MCG (2000 UT) Chew Chew 50 mcg by mouth daily.    zinc  oxide 20 % ointment Apply 1 Application topically 3 (three) times daily.   Relevant Imaging Results:  Relevant Lab Results:   Additional Information [id number]  Marlowe Sax, RN
